# Patient Record
Sex: Female | Born: 1940 | Race: White | Hispanic: No | Smoking: Never smoker
Health system: Southern US, Community
[De-identification: ages and names within clinical notes are randomized; demographics above are authoritative.]

## PROBLEM LIST (undated history)

## (undated) DIAGNOSIS — Z950 Presence of cardiac pacemaker: Secondary | ICD-10-CM

## (undated) DIAGNOSIS — R251 Tremor, unspecified: Secondary | ICD-10-CM

## (undated) DIAGNOSIS — I2510 Atherosclerotic heart disease of native coronary artery without angina pectoris: Secondary | ICD-10-CM

## (undated) DIAGNOSIS — I509 Heart failure, unspecified: Secondary | ICD-10-CM

## (undated) DIAGNOSIS — G4730 Sleep apnea, unspecified: Secondary | ICD-10-CM

## (undated) DIAGNOSIS — F32A Depression, unspecified: Secondary | ICD-10-CM

## (undated) DIAGNOSIS — F329 Major depressive disorder, single episode, unspecified: Secondary | ICD-10-CM

## (undated) DIAGNOSIS — I10 Essential (primary) hypertension: Secondary | ICD-10-CM

## (undated) DIAGNOSIS — E119 Type 2 diabetes mellitus without complications: Secondary | ICD-10-CM

## (undated) DIAGNOSIS — I219 Acute myocardial infarction, unspecified: Secondary | ICD-10-CM

## (undated) DIAGNOSIS — I739 Peripheral vascular disease, unspecified: Secondary | ICD-10-CM

## (undated) DIAGNOSIS — I441 Atrioventricular block, second degree: Secondary | ICD-10-CM

## (undated) HISTORY — PX: ABDOMINAL HYSTERECTOMY: SHX81

## (undated) HISTORY — PX: CORONARY STENT PLACEMENT: SHX1402

## (undated) HISTORY — PX: CORONARY ARTERY BYPASS GRAFT: SHX141

## (undated) HISTORY — PX: INSERT / REPLACE / REMOVE PACEMAKER: SUR710

---

## 2000-05-03 ENCOUNTER — Other Ambulatory Visit: Admission: RE | Admit: 2000-05-03 | Discharge: 2000-05-03 | Payer: Self-pay | Admitting: Obstetrics and Gynecology

## 2014-12-22 ENCOUNTER — Observation Stay: Payer: Medicare Other

## 2014-12-22 ENCOUNTER — Emergency Department: Payer: Medicare Other

## 2014-12-22 ENCOUNTER — Observation Stay
Admit: 2014-12-22 | Discharge: 2014-12-22 | Disposition: A | Discharge status: Home / Self Care | Payer: Medicare Other | Attending: Internal Medicine | Admitting: Internal Medicine

## 2014-12-22 ENCOUNTER — Encounter: Payer: Self-pay | Admitting: Emergency Medicine

## 2014-12-22 ENCOUNTER — Inpatient Hospital Stay
Admission: EM | Admit: 2014-12-22 | Discharge: 2014-12-26 | DRG: 065 | Disposition: A | Discharge status: Skilled Nursing Facility | Payer: Medicare Other | Attending: Internal Medicine | Admitting: Internal Medicine

## 2014-12-22 DIAGNOSIS — Z7982 Long term (current) use of aspirin: Secondary | ICD-10-CM

## 2014-12-22 DIAGNOSIS — R200 Anesthesia of skin: Secondary | ICD-10-CM

## 2014-12-22 DIAGNOSIS — E860 Dehydration: Secondary | ICD-10-CM | POA: Diagnosis present

## 2014-12-22 DIAGNOSIS — Z951 Presence of aortocoronary bypass graft: Secondary | ICD-10-CM

## 2014-12-22 DIAGNOSIS — Z90710 Acquired absence of both cervix and uterus: Secondary | ICD-10-CM

## 2014-12-22 DIAGNOSIS — Z79899 Other long term (current) drug therapy: Secondary | ICD-10-CM

## 2014-12-22 DIAGNOSIS — E1151 Type 2 diabetes mellitus with diabetic peripheral angiopathy without gangrene: Secondary | ICD-10-CM | POA: Diagnosis present

## 2014-12-22 DIAGNOSIS — I6521 Occlusion and stenosis of right carotid artery: Secondary | ICD-10-CM | POA: Diagnosis present

## 2014-12-22 DIAGNOSIS — R531 Weakness: Secondary | ICD-10-CM

## 2014-12-22 DIAGNOSIS — R410 Disorientation, unspecified: Secondary | ICD-10-CM

## 2014-12-22 DIAGNOSIS — Z955 Presence of coronary angioplasty implant and graft: Secondary | ICD-10-CM

## 2014-12-22 DIAGNOSIS — I2510 Atherosclerotic heart disease of native coronary artery without angina pectoris: Secondary | ICD-10-CM | POA: Diagnosis present

## 2014-12-22 DIAGNOSIS — Z8249 Family history of ischemic heart disease and other diseases of the circulatory system: Secondary | ICD-10-CM

## 2014-12-22 DIAGNOSIS — I110 Hypertensive heart disease with heart failure: Secondary | ICD-10-CM | POA: Diagnosis present

## 2014-12-22 DIAGNOSIS — I639 Cerebral infarction, unspecified: Principal | ICD-10-CM

## 2014-12-22 DIAGNOSIS — G4733 Obstructive sleep apnea (adult) (pediatric): Secondary | ICD-10-CM | POA: Diagnosis present

## 2014-12-22 DIAGNOSIS — E785 Hyperlipidemia, unspecified: Secondary | ICD-10-CM | POA: Diagnosis present

## 2014-12-22 DIAGNOSIS — I252 Old myocardial infarction: Secondary | ICD-10-CM

## 2014-12-22 DIAGNOSIS — R040 Epistaxis: Secondary | ICD-10-CM | POA: Diagnosis present

## 2014-12-22 DIAGNOSIS — Z66 Do not resuscitate: Secondary | ICD-10-CM | POA: Diagnosis present

## 2014-12-22 DIAGNOSIS — I5032 Chronic diastolic (congestive) heart failure: Secondary | ICD-10-CM | POA: Diagnosis present

## 2014-12-22 HISTORY — DX: Depression, unspecified: F32.A

## 2014-12-22 HISTORY — DX: Type 2 diabetes mellitus without complications: E11.9

## 2014-12-22 HISTORY — DX: Acute myocardial infarction, unspecified: I21.9

## 2014-12-22 HISTORY — DX: Sleep apnea, unspecified: G47.30

## 2014-12-22 HISTORY — DX: Heart failure, unspecified: I50.9

## 2014-12-22 HISTORY — DX: Atrioventricular block, second degree: I44.1

## 2014-12-22 HISTORY — DX: Presence of cardiac pacemaker: Z95.0

## 2014-12-22 HISTORY — DX: Tremor, unspecified: R25.1

## 2014-12-22 HISTORY — DX: Essential (primary) hypertension: I10

## 2014-12-22 HISTORY — DX: Peripheral vascular disease, unspecified: I73.9

## 2014-12-22 HISTORY — DX: Atherosclerotic heart disease of native coronary artery without angina pectoris: I25.10

## 2014-12-22 HISTORY — DX: Major depressive disorder, single episode, unspecified: F32.9

## 2014-12-22 LAB — CBC WITH DIFFERENTIAL/PLATELET
BASOS ABS: 0.1 10*3/uL (ref 0–0.1)
Basophils Relative: 1 %
Eosinophils Absolute: 0.1 10*3/uL (ref 0–0.7)
Eosinophils Relative: 1 %
HEMATOCRIT: 45.9 % (ref 35.0–47.0)
Hemoglobin: 14.9 g/dL (ref 12.0–16.0)
LYMPHS PCT: 46 %
Lymphs Abs: 3.5 10*3/uL (ref 1.0–3.6)
MCH: 29.8 pg (ref 26.0–34.0)
MCHC: 32.5 g/dL (ref 32.0–36.0)
MCV: 91.8 fL (ref 80.0–100.0)
MONO ABS: 0.7 10*3/uL (ref 0.2–0.9)
Monocytes Relative: 10 %
NEUTROS ABS: 3.1 10*3/uL (ref 1.4–6.5)
Neutrophils Relative %: 42 %
Platelets: 230 10*3/uL (ref 150–440)
RBC: 5.01 MIL/uL (ref 3.80–5.20)
RDW: 15.4 % — AB (ref 11.5–14.5)
WBC: 7.4 10*3/uL (ref 3.6–11.0)

## 2014-12-22 LAB — GLUCOSE, CAPILLARY
GLUCOSE-CAPILLARY: 164 mg/dL — AB (ref 65–99)
GLUCOSE-CAPILLARY: 153 mg/dL — AB (ref 65–99)
Glucose-Capillary: 178 mg/dL — ABNORMAL HIGH (ref 65–99)
Glucose-Capillary: 184 mg/dL — ABNORMAL HIGH (ref 65–99)

## 2014-12-22 LAB — URINALYSIS COMPLETE WITH MICROSCOPIC (ARMC ONLY)
Bilirubin Urine: NEGATIVE
Glucose, UA: NEGATIVE mg/dL
Hgb urine dipstick: NEGATIVE
KETONES UR: NEGATIVE mg/dL
Leukocytes, UA: NEGATIVE
Nitrite: NEGATIVE
Protein, ur: NEGATIVE mg/dL
RBC / HPF: NONE SEEN RBC/hpf (ref 0–5)
Specific Gravity, Urine: 1.009 (ref 1.005–1.030)
pH: 5 (ref 5.0–8.0)

## 2014-12-22 LAB — URINE DRUG SCREEN, QUALITATIVE (ARMC ONLY)
AMPHETAMINES, UR SCREEN: NOT DETECTED
Barbiturates, Ur Screen: NOT DETECTED
Benzodiazepine, Ur Scrn: NOT DETECTED
Cannabinoid 50 Ng, Ur ~~LOC~~: NOT DETECTED
Cocaine Metabolite,Ur ~~LOC~~: NOT DETECTED
MDMA (ECSTASY) UR SCREEN: NOT DETECTED
METHADONE SCREEN, URINE: NOT DETECTED
OPIATE, UR SCREEN: NOT DETECTED
PHENCYCLIDINE (PCP) UR S: NOT DETECTED
Tricyclic, Ur Screen: NOT DETECTED

## 2014-12-22 LAB — COMPREHENSIVE METABOLIC PANEL
ALK PHOS: 63 U/L (ref 38–126)
ALT: 16 U/L (ref 14–54)
AST: 20 U/L (ref 15–41)
Albumin: 4.5 g/dL (ref 3.5–5.0)
Anion gap: 9 (ref 5–15)
BILIRUBIN TOTAL: 0.4 mg/dL (ref 0.3–1.2)
BUN: 27 mg/dL — AB (ref 6–20)
CALCIUM: 10 mg/dL (ref 8.9–10.3)
CO2: 26 mmol/L (ref 22–32)
CREATININE: 1.05 mg/dL — AB (ref 0.44–1.00)
Chloride: 105 mmol/L (ref 101–111)
GFR calc Af Amer: 59 mL/min — ABNORMAL LOW (ref >60)
GFR, EST NON AFRICAN AMERICAN: 51 mL/min — AB (ref >60)
Glucose, Bld: 178 mg/dL — ABNORMAL HIGH (ref 65–99)
Potassium: 4.4 mmol/L (ref 3.5–5.1)
Sodium: 140 mmol/L (ref 135–145)
TOTAL PROTEIN: 8.2 g/dL — AB (ref 6.5–8.1)

## 2014-12-22 LAB — MAGNESIUM: MAGNESIUM: 2.4 mg/dL (ref 1.7–2.4)

## 2014-12-22 LAB — TROPONIN I

## 2014-12-22 LAB — SALICYLATE LEVEL

## 2014-12-22 LAB — ACETAMINOPHEN LEVEL: Acetaminophen (Tylenol), Serum: <10 ug/mL — ABNORMAL LOW (ref 10–30)

## 2014-12-22 LAB — ETHANOL

## 2014-12-22 MED ORDER — ENOXAPARIN SODIUM 40 MG/0.4ML ~~LOC~~ SOLN
40.0000 mg | SUBCUTANEOUS | Status: DC
  Administered 2014-12-22: 20:00:00 40 mg via SUBCUTANEOUS
  Filled 2014-12-22: qty 0.4

## 2014-12-22 MED ORDER — FESOTERODINE FUMARATE ER 4 MG PO TB24
4.0000 mg | ORAL_TABLET | Freq: Every day | ORAL | Status: DC
  Administered 2014-12-22 – 2014-12-26 (×5): 4 mg via ORAL
  Filled 2014-12-22 (×5): qty 1

## 2014-12-22 MED ORDER — EZETIMIBE 10 MG PO TABS
10.0000 mg | ORAL_TABLET | Freq: Every day | ORAL | Status: DC
  Administered 2014-12-22 – 2014-12-25 (×4): 10 mg via ORAL
  Filled 2014-12-22 (×4): qty 1

## 2014-12-22 MED ORDER — FUROSEMIDE 20 MG PO TABS
20.0000 mg | ORAL_TABLET | Freq: Every day | ORAL | Status: DC
  Administered 2014-12-22: 20 mg via ORAL
  Filled 2014-12-22: qty 1

## 2014-12-22 MED ORDER — VALACYCLOVIR HCL 500 MG PO TABS
500.0000 mg | ORAL_TABLET | Freq: Two times a day (BID) | ORAL | Status: DC
  Administered 2014-12-22 – 2014-12-26 (×9): 500 mg via ORAL
  Filled 2014-12-22 (×9): qty 1

## 2014-12-22 MED ORDER — DOCUSATE SODIUM 100 MG PO CAPS
200.0000 mg | ORAL_CAPSULE | Freq: Every day | ORAL | Status: DC
  Administered 2014-12-22 – 2014-12-25 (×4): 200 mg via ORAL
  Filled 2014-12-22 (×4): qty 2

## 2014-12-22 MED ORDER — INSULIN ASPART 100 UNIT/ML ~~LOC~~ SOLN
0.0000 [IU] | Freq: Three times a day (TID) | SUBCUTANEOUS | Status: DC
  Administered 2014-12-22 (×2): 2 [IU] via SUBCUTANEOUS
  Administered 2014-12-23: 18:00:00 1 [IU] via SUBCUTANEOUS
  Administered 2014-12-23 (×2): 2 [IU] via SUBCUTANEOUS
  Administered 2014-12-24 (×2): 1 [IU] via SUBCUTANEOUS
  Administered 2014-12-24: 2 [IU] via SUBCUTANEOUS
  Administered 2014-12-25 (×2): 1 [IU] via SUBCUTANEOUS
  Administered 2014-12-25: 12:00:00 2 [IU] via SUBCUTANEOUS
  Filled 2014-12-22: qty 1
  Filled 2014-12-22: qty 2
  Filled 2014-12-22: qty 1
  Filled 2014-12-22: qty 2
  Filled 2014-12-22 (×2): qty 1
  Filled 2014-12-22: qty 2
  Filled 2014-12-22: qty 1
  Filled 2014-12-22 (×3): qty 2

## 2014-12-22 MED ORDER — STROKE: EARLY STAGES OF RECOVERY BOOK
Freq: Once | Status: AC
  Administered 2014-12-22: 13:00:00

## 2014-12-22 MED ORDER — ACETAMINOPHEN 325 MG PO TABS
ORAL_TABLET | ORAL | Status: AC
  Administered 2014-12-22: 650 mg via ORAL
  Filled 2014-12-22: qty 2

## 2014-12-22 MED ORDER — ZOLPIDEM TARTRATE 5 MG PO TABS: 5.0000 mg | ORAL_TABLET | Freq: Every evening | ORAL | Status: DC | PRN

## 2014-12-22 MED ORDER — SENNOSIDES-DOCUSATE SODIUM 8.6-50 MG PO TABS: 1.0000 | ORAL_TABLET | Freq: Every evening | ORAL | Status: DC | PRN

## 2014-12-22 MED ORDER — IOHEXOL 350 MG/ML SOLN
75.0000 mL | Freq: Once | INTRAVENOUS | Status: AC | PRN
  Administered 2014-12-22: 75 mL via INTRAVENOUS

## 2014-12-22 MED ORDER — FAMCICLOVIR 500 MG PO TABS
250.0000 mg | ORAL_TABLET | Freq: Two times a day (BID) | ORAL | Status: DC
  Filled 2014-12-22 (×2): qty 0.5

## 2014-12-22 MED ORDER — ACETAMINOPHEN 325 MG PO TABS
650.0000 mg | ORAL_TABLET | Freq: Once | ORAL | Status: AC
  Administered 2014-12-22: 650 mg via ORAL

## 2014-12-22 MED ORDER — INFLUENZA VAC SPLIT QUAD 0.5 ML IM SUSY
0.5000 mL | PREFILLED_SYRINGE | INTRAMUSCULAR | Status: AC
  Administered 2014-12-23: 09:00:00 0.5 mL via INTRAMUSCULAR
  Filled 2014-12-22: qty 0.5

## 2014-12-22 MED ORDER — CLOPIDOGREL BISULFATE 75 MG PO TABS
75.0000 mg | ORAL_TABLET | Freq: Every day | ORAL | Status: DC
  Administered 2014-12-22 – 2014-12-26 (×5): 75 mg via ORAL
  Filled 2014-12-22 (×5): qty 1

## 2014-12-22 MED ORDER — ASPIRIN 325 MG PO TABS
325.0000 mg | ORAL_TABLET | Freq: Every day | ORAL | Status: DC
  Administered 2014-12-22 – 2014-12-23 (×2): 325 mg via ORAL
  Filled 2014-12-22 (×2): qty 1

## 2014-12-22 MED ORDER — ACETAMINOPHEN 325 MG PO TABS
650.0000 mg | ORAL_TABLET | Freq: Four times a day (QID) | ORAL | Status: DC | PRN
  Administered 2014-12-22 – 2014-12-24 (×2): 650 mg via ORAL
  Filled 2014-12-22 (×3): qty 2

## 2014-12-22 MED ORDER — INSULIN ASPART 100 UNIT/ML ~~LOC~~ SOLN: 0.0000 [IU] | Freq: Every day | SUBCUTANEOUS | Status: DC

## 2014-12-22 MED ORDER — RANOLAZINE ER 500 MG PO TB12
500.0000 mg | ORAL_TABLET | Freq: Two times a day (BID) | ORAL | Status: DC
  Administered 2014-12-22 – 2014-12-26 (×9): 500 mg via ORAL
  Filled 2014-12-22 (×10): qty 1

## 2014-12-22 MED ORDER — VENLAFAXINE HCL ER 75 MG PO CP24
75.0000 mg | ORAL_CAPSULE | Freq: Every day | ORAL | Status: DC
  Administered 2014-12-22 – 2014-12-26 (×5): 75 mg via ORAL
  Filled 2014-12-22 (×6): qty 1

## 2014-12-22 MED ORDER — BUPROPION HCL ER (XL) 150 MG PO TB24
150.0000 mg | ORAL_TABLET | Freq: Every day | ORAL | Status: DC
  Administered 2014-12-22 – 2014-12-26 (×5): 150 mg via ORAL
  Filled 2014-12-22 (×5): qty 1

## 2014-12-22 MED ORDER — NITROGLYCERIN 0.4 MG SL SUBL: 0.4000 mg | SUBLINGUAL_TABLET | SUBLINGUAL | Status: DC | PRN

## 2014-12-22 MED ORDER — SIMVASTATIN 40 MG PO TABS
40.0000 mg | ORAL_TABLET | Freq: Every day | ORAL | Status: DC
  Administered 2014-12-22: 40 mg via ORAL
  Filled 2014-12-22: qty 1

## 2014-12-22 MED ORDER — FUROSEMIDE 10 MG/ML IJ SOLN
20.0000 mg | Freq: Once | INTRAMUSCULAR | Status: AC
  Administered 2014-12-22: 20 mg via INTRAVENOUS
  Filled 2014-12-22: qty 4

## 2014-12-22 MED ORDER — TRAMADOL HCL 50 MG PO TABS
50.0000 mg | ORAL_TABLET | Freq: Four times a day (QID) | ORAL | Status: DC | PRN
  Administered 2014-12-22 – 2014-12-25 (×3): 50 mg via ORAL
  Filled 2014-12-22 (×4): qty 1

## 2014-12-22 MED ORDER — CLONAZEPAM 1 MG PO TABS
1.0000 mg | ORAL_TABLET | Freq: Three times a day (TID) | ORAL | Status: DC
  Administered 2014-12-22 – 2014-12-26 (×13): 1 mg via ORAL
  Filled 2014-12-22 (×13): qty 1

## 2014-12-22 MED ORDER — ATENOLOL 50 MG PO TABS: 25.0000 mg | ORAL_TABLET | Freq: Every day | ORAL | Status: DC

## 2014-12-22 MED ORDER — LISINOPRIL 10 MG PO TABS: 10.0000 mg | ORAL_TABLET | Freq: Every day | ORAL | Status: DC

## 2014-12-22 MED ORDER — BUPROPION HCL ER (SR) 150 MG PO TB12: 150.0000 mg | ORAL_TABLET | Freq: Every day | ORAL | Status: DC

## 2014-12-22 NOTE — ED Notes (Signed)
Attempted to call report

## 2014-12-22 NOTE — H&P (Signed)
[facility] - [place] at [facility]   PATIENT NAME: Carla Dunn    MR#:  [id number]  DATE OF BIRTH:  03-Jan-1941  DATE OF ADMISSION:  12/22/2014  PRIMARY CARE PHYSICIAN: Michail Sermon, MD   REQUESTING/REFERRING PHYSICIAN: Governor Rooks, MD  CHIEF COMPLAINT:   Chief Complaint  Patient presents with  . Altered Mental Status  left side numbness and tingling today.  HISTORY OF PRESENT ILLNESS:  Carla Dunn  is a 74 y.o. female with a known history of hypertension, diabetes, CAD, diastolic CHF, PVD and sleep apnea. The patient is a poor historian. According to her daughter, the patient started to have a left facial numbness and tingling in the left arm and leg numbness and tingling at about 3 am today. The patient was found unresponsive for a few minutes at about 5 AM. So her daughter called EMS. The patient has generalized weakness but denies any slurred speech or dysphagia. She denies any headache, dizziness or incontinence. CAT scan of head is negative for stroke.  PAST MEDICAL HISTORY:   Past Medical History  Diagnosis Date  . Diabetes mellitus without complication (HCC)   . Tremors of nervous system   . Pacemaker   . Peripheral vascular disease (HCC)   . CHF (congestive heart failure) (HCC)   . Sleep apnea   . Myocardial infarction (HCC)   . Coronary artery disease   . Depression   . Second degree heart block     PAST SURGICAL HISTORY:   Past Surgical History  Procedure Laterality Date  . Coronary artery bypass graft    . Coronary stent placement    . Abdominal hysterectomy    . Insert / replace / remove pacemaker      SOCIAL HISTORY:   Social History  Substance Use Topics  . Smoking status: Unknown If Ever Smoked  . Smokeless tobacco: Not on file  . Alcohol Use: Not on file    FAMILY HISTORY:   Family History  Problem Relation Age of Onset  . Heart attack Mother   . Heart attack Father   . Heart attack Brother     DRUG  ALLERGIES:  No Known Allergies  REVIEW OF SYSTEMS:  CONSTITUTIONAL: No fever, has weakness.  EYES: Has blurred vision, no double vision.  EARS, NOSE, AND THROAT: No tinnitus or ear pain.  RESPIRATORY: No cough, shortness of breath, wheezing or hemoptysis.  CARDIOVASCULAR: No chest pain, orthopnea, edema.  GASTROINTESTINAL: No nausea, vomiting, diarrhea or abdominal pain.  GENITOURINARY: No dysuria, hematuria.  ENDOCRINE: No polyuria, nocturia,  HEMATOLOGY: No anemia, easy bruising or bleeding SKIN: No rash or lesion. MUSCULOSKELETAL: No joint pain or arthritis.   NEUROLOGIC: facial tingling, numbness on left side, left arm and leg numbness, tingling and weakness.  PSYCHIATRY: No anxiety or depression.   MEDICATIONS AT HOME:   Prior to Admission medications   Medication Sig Start Date End Date Taking? Authorizing Provider  Ascorbic Acid (VITAMIN C) 1000 MG tablet Take 1,000 mg by mouth daily.   Yes Historical Provider, MD  aspirin 81 MG tablet Take 81 mg by mouth daily.   Yes Historical Provider, MD  atenolol (TENORMIN) 25 MG tablet Take 25 mg by mouth daily.   Yes Historical Provider, MD  buPROPion (WELLBUTRIN SR) 150 MG 12 hr tablet Take 150 mg by mouth daily.   Yes Historical Provider, MD  calcium-vitamin D (OSCAL WITH D) 500-200 MG-UNIT tablet Take 1 tablet by mouth.   Yes Historical Provider,  MD  clonazePAM (KLONOPIN) 1 MG tablet Take 1 mg by mouth 3 (three) times daily.   Yes Historical Provider, MD  clopidogrel (PLAVIX) 75 MG tablet Take 75 mg by mouth daily.   Yes Historical Provider, MD  co-enzyme Q-10 30 MG capsule Take 100 mg by mouth 3 (three) times daily.   Yes Historical Provider, MD  desvenlafaxine (PRISTIQ) 50 MG 24 hr tablet Take 50 mg by mouth daily.   Yes Historical Provider, MD  docusate sodium (COLACE) 100 MG capsule Take 200 mg by mouth at bedtime.   Yes Historical Provider, MD  ezetimibe (ZETIA) 10 MG tablet Take 10 mg by mouth daily.   Yes Historical Provider,  MD  famciclovir (FAMVIR) 250 MG tablet Take 250 mg by mouth 2 (two) times daily.   Yes Historical Provider, MD  furosemide (LASIX) 20 MG tablet Take 20 mg by mouth.   Yes Historical Provider, MD  lisinopril (PRINIVIL,ZESTRIL) 10 MG tablet Take 10 mg by mouth at bedtime.   Yes Historical Provider, MD  magnesium oxide (MAG-OX) 400 MG tablet Take 500 mg by mouth daily.   Yes Historical Provider, MD  ranolazine (RANEXA) 500 MG 12 hr tablet Take 500 mg by mouth 2 (two) times daily.   Yes Historical Provider, MD  simvastatin (ZOCOR) 10 MG tablet Take 10 mg by mouth daily.   Yes Historical Provider, MD  sitaGLIPtin (JANUVIA) 100 MG tablet Take 100 mg by mouth daily.   Yes Historical Provider, MD  tolterodine (DETROL LA) 2 MG 24 hr capsule Take 2 mg by mouth daily.   Yes Historical Provider, MD  zolpidem (AMBIEN) 5 MG tablet Take 5 mg by mouth at bedtime as needed for sleep.   Yes Historical Provider, MD  nitroGLYCERIN (NITROSTAT) 0.4 MG SL tablet Place 0.4 mg under the tongue every 5 (five) minutes as needed for chest pain.    Historical Provider, MD  traMADol (ULTRAM) 50 MG tablet Take by mouth every 8 (eight) hours as needed for moderate pain.    Historical Provider, MD      VITAL SIGNS:  Blood pressure 122/58, pulse 71, temperature 97.8 F (36.6 C), temperature source Oral, resp. rate 22, weight 83.643 kg (184 lb 6.4 oz), SpO2 96 %.  PHYSICAL EXAMINATION:  GENERAL:  74 y.o.-year-old patient lying in the bed with no acute distress.  EYES: Pupils equal, round, reactive to light and accommodation. No scleral icterus. Extraocular muscles intact.  HEENT: Head atraumatic, normocephalic. Oropharynx and nasopharynx clear.  NECK:  Supple, no jugular venous distention. No thyroid enlargement, no tenderness.  LUNGS: Normal breath sounds bilaterally, no wheezing, rales,rhonchi or crepitation. No use of accessory muscles of respiration.  CARDIOVASCULAR: S1, S2 normal. No murmurs, rubs, or gallops.  ABDOMEN:  Soft, nontender, nondistended. Bowel sounds present. No organomegaly or mass.  EXTREMITIES: No pedal edema, cyanosis, or clubbing.  NEUROLOGIC: Cranial nerves II through XII are intact. Muscle strength 2/5 in lower extremities, 4/5 in upper extremities. Sensation intact. Gait not checked.  PSYCHIATRIC: The patient is alert and oriented x 3.  SKIN: No obvious rash, lesion, or ulcer.   LABORATORY PANEL:   CBC  Recent Labs Lab 12/22/14 0613  WBC 7.4  HGB 14.9  HCT 45.9  PLT 230   ------------------------------------------------------------------------------------------------------------------  Chemistries   Recent Labs Lab 12/22/14 0613  NA 140  K 4.4  CL 105  CO2 26  GLUCOSE 178*  BUN 27*  CREATININE 1.05*  CALCIUM 10.0  AST 20  ALT 16  ALKPHOS  63  BILITOT 0.4   ------------------------------------------------------------------------------------------------------------------  Cardiac Enzymes  Recent Labs Lab 12/22/14 0613  TROPONINI <0.03   ------------------------------------------------------------------------------------------------------------------  RADIOLOGY:  Ct Head Wo Contrast  12/22/2014   CLINICAL DATA:  Awoke at 3:30 a.m., tachycardia, arrived from hotel. 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  EXAM: CT HEAD WITHOUT CONTRAST  TECHNIQUE: Contiguous axial images were obtained from the base of the skull through the vertex without intravenous contrast.  COMPARISON:  None.  FINDINGS: Mild noisy image quality.  The ventricles and sulci are normal for age. No intraparenchymal hemorrhage, mass effect nor midline shift. Patchy supratentorial Glance matter hypodensities are less than expected for patient's age and though non-specific suggest sequelae of chronic small vessel ischemic disease. No acute large vascular territory infarcts.  No abnormal extra-axial fluid collections. Basal cisterns are patent. Severe calcific atherosclerosis of the carotid siphons and  included vertebral arteries.  No skull fracture. The included ocular globes and orbital contents are non-suspicious. Status post bilateral ocular lens implant. The mastoid aircells and included paranasal sinuses are well-aerated.  IMPRESSION: No acute intracranial process.  Severe calcific atherosclerosis. Involutional changes and mild chronic small vessel ischemic disease.   Electronically Signed   By: Awilda Metro M.D.   On: 12/22/2014 06:50   Dg Chest Portable 1 View  12/22/2014   CLINICAL DATA:  Acute onset of tachycardia. Patient not feeling right. Initial encounter.  EXAM: PORTABLE CHEST 1 VIEW  COMPARISON:  None.  FINDINGS: The lungs are mildly hypoexpanded. Vascular crowding and vascular congestion are seen, with mildly increased interstitial markings, raising question for mild interstitial edema. There is no evidence of pleural effusion or pneumothorax.  The cardiomediastinal silhouette is borderline normal in size. The patient is status post median sternotomy. A pacemaker is noted overlying the left chest wall, with leads ending overlying the right atrium and right ventricle. No acute osseous abnormalities are seen. Scattered clips are seen overlying the left hemithorax.  IMPRESSION: Lungs mildly hypoexpanded. Vascular congestion noted, with mildly increased interstitial markings, raising question for mild interstitial edema.   Electronically Signed   By: Roanna Raider M.D.   On: 12/22/2014 07:10    EKG:   Orders placed or performed during the hospital encounter of 12/22/14  . EKG 12-Lead  . EKG 12-Lead  . ED EKG  . ED EKG    IMPRESSION AND PLAN:   Left side numbness and tingling, R/O acute CVA Dehydration CAD HTN DM2 PVD Chronic diastolic CHF OSA   Plan:  Tele monitor, start ASA, plavix, zocor. Follow up echo, carotid Dupplex, lipid panel. Neurology consult. 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 PT, OT. Continue HTN meds. Sliding scale.     All the records are reviewed  and case discussed with ED provider. Management plans discussed with the patient, her daughter and they are in agreement.  CODE STATUS: DNR  TOTAL TIME TAKING CARE OF THIS PATIENT: 55 minutes.    Shaune Pollack M.D on 12/22/2014 at 8:33 AM  Between 7am to 6pm - Pager - [phone]  After 6pm go to [email or web address] - password [email or web address] [facility]  [place] [facility]  Office  [phone]  CC: Primary care physician; Michail Sermon, MD

## 2014-12-22 NOTE — Progress Notes (Cosign Needed)
This patient has received 15 ml's of IV omni 350 (type of contrast) contrast extravasation into left forarm (part of body) during a CT Angio Neck exam.  The exam was performed on (date) 12/22/14  Site / affected area assessed by Alan Ripper to Palma Sola, RN

## 2014-12-22 NOTE — Plan of Care (Signed)
Problem: Discharge/Transitional Outcomes Goal: Other Discharge Outcomes/Goals Outcome: Progressing Patient admitted today with symptoms of cva with left sided weakness and numbness, vss, daughter at bedside and helps patient at home, patient continues to have neuro checks every 2 hours which have been the same as on admission, patient had ct, u/s, and echo today.

## 2014-12-22 NOTE — ED Notes (Signed)
Pt ambulated to restroom with 2 person assist 

## 2014-12-22 NOTE — ED Notes (Signed)
Pt reports tingling to left side, MD informed. No new orders at this time.

## 2014-12-22 NOTE — Progress Notes (Signed)
*  PRELIMINARY RESULTS* Echocardiogram 2D Echocardiogram has been performed.  Carla Dunn 12/22/2014, 11:03 AM

## 2014-12-22 NOTE — ED Notes (Signed)
Patient returns from CT

## 2014-12-22 NOTE — ED Provider Notes (Signed)
[facility] Emergency Department Provider Note   ____________________________________________  Time seen: 7:15 AM I have reviewed the triage vital signs and the triage nursing note.  HISTORY  Chief Complaint Altered Mental Status   Historian Limited historian, patient is a poor historian Daughter provides some history.  HPI Carla Dunn is a 74 y.o. female who is here for evaluation of confusion/altered mental status as well as left facial arm and leg numbness. Patient daughter are currently living in a hotel out of concern about mold in their home. Patient states she hasn't felt well for a couple of days, however this morning she woke up around 3:30 and told her daughter that her heart was racing. This has happen time to time and she is to take an extra dose of atenolol when this happens. She did take an extra dose of atenolol and then went back to sleep. She woke up around 5:30 and was extremely weak all over and told her daughter that she needed to go to the hospital.  She does have a history of sleep apnea, however has not been using her CPAP machine recently. She has history of recent nosebleeds.  It is a little unclear exactly when the left facial arm and leg numbness started, however patient states that she thinks it was there yesterday.  No abdominal pain, nausea, vomiting or diarrhea. No fever, cough, congestion. No skin rashes. No tick bites.    Past Medical History  Diagnosis Date  . Diabetes mellitus without complication (HCC)   . Tremors of nervous system   . Pacemaker     There are no active problems to display for this patient.   Past Surgical History  Procedure Laterality Date  . Coronary artery bypass graft    . Coronary stent placement      Current Outpatient Rx  Name  Route  Sig  Dispense  Refill  . Ascorbic Acid (VITAMIN C) 1000 MG tablet   Oral   Take 1,000 mg by mouth daily.         Marland Kitchen aspirin 81 MG tablet   Oral   Take  81 mg by mouth daily.         Marland Kitchen atenolol (TENORMIN) 25 MG tablet   Oral   Take 25 mg by mouth daily.         Marland Kitchen buPROPion (WELLBUTRIN SR) 150 MG 12 hr tablet   Oral   Take 150 mg by mouth daily.         . calcium-vitamin D (OSCAL WITH D) 500-200 MG-UNIT tablet   Oral   Take 1 tablet by mouth.         . clonazePAM (KLONOPIN) 1 MG tablet   Oral   Take 1 mg by mouth 3 (three) times daily.         . clopidogrel (PLAVIX) 75 MG tablet   Oral   Take 75 mg by mouth daily.         Marland Kitchen co-enzyme Q-10 30 MG capsule   Oral   Take 100 mg by mouth 3 (three) times daily.         Marland Kitchen desvenlafaxine (PRISTIQ) 50 MG 24 hr tablet   Oral   Take 50 mg by mouth daily.         Marland Kitchen docusate sodium (COLACE) 100 MG capsule   Oral   Take 200 mg by mouth at bedtime.         Marland Kitchen ezetimibe (ZETIA) 10 MG tablet  Oral   Take 10 mg by mouth daily.         . famciclovir (FAMVIR) 250 MG tablet   Oral   Take 250 mg by mouth 2 (two) times daily.         . furosemide (LASIX) 20 MG tablet   Oral   Take 20 mg by mouth.         Marland Kitchen lisinopril (PRINIVIL,ZESTRIL) 10 MG tablet   Oral   Take 10 mg by mouth at bedtime.         . magnesium oxide (MAG-OX) 400 MG tablet   Oral   Take 500 mg by mouth daily.         . ranolazine (RANEXA) 500 MG 12 hr tablet   Oral   Take 500 mg by mouth 2 (two) times daily.         . simvastatin (ZOCOR) 10 MG tablet   Oral   Take 10 mg by mouth daily.         . sitaGLIPtin (JANUVIA) 100 MG tablet   Oral   Take 100 mg by mouth daily.         Marland Kitchen tolterodine (DETROL LA) 2 MG 24 hr capsule   Oral   Take 2 mg by mouth daily.         Marland Kitchen zolpidem (AMBIEN) 5 MG tablet   Oral   Take 5 mg by mouth at bedtime as needed for sleep.         . nitroGLYCERIN (NITROSTAT) 0.4 MG SL tablet   Sublingual   Place 0.4 mg under the tongue every 5 (five) minutes as needed for chest pain.         . traMADol (ULTRAM) 50 MG tablet   Oral   Take by mouth  every 8 (eight) hours as needed for moderate pain.           Allergies Review of patient's allergies indicates no known allergies.  History reviewed. No pertinent family history.  Social History Social History  Substance Use Topics  . Smoking status: Unknown If Ever Smoked  . Smokeless tobacco: None  . Alcohol Use: None    Review of Systems  Constitutional: Negative for fever. Eyes: Negative for visual changes. ENT: Negative for sore throat. Cardiovascular: Negative for chest pain. Respiratory: Negative for shortness of breath. Gastrointestinal: Negative for abdominal pain, vomiting and diarrhea. Genitourinary: Negative for dysuria. Musculoskeletal: Negative for back pain. Skin: Negative for rash. Neurological: Positive for occasional headaches. None now. 10 point Review of Systems otherwise negative ____________________________________________   PHYSICAL EXAM:  VITAL SIGNS: ED Triage Vitals  Enc Vitals Group     BP 12/22/14 0615 138/76 mmHg     Pulse Rate 12/22/14 0615 82     Resp 12/22/14 0615 20     Temp 12/22/14 0621 97.8 F (36.6 C)     Temp Source 12/22/14 0621 Oral     SpO2 12/22/14 0615 84 %     Weight 12/22/14 0615 184 lb 6.4 oz (83.643 kg)     Height --      Head Cir --      Peak Flow --      Pain Score --      Pain Loc --      Pain Edu? --      Excl. in GC? --      Constitutional: Alert and slightly confused to place. Cooperative, but somewhat slow to answer. In no distress. Eyes: Conjunctivae are  normal. Right eyelid slightly droopy compared to the left.  PERRL. Normal extraocular movements. ENT   Head: Normocephalic and atraumatic.   Nose: No congestion/rhinnorhea.   Mouth/Throat: Mucous membranes are moist.   Neck: No stridor. Cardiovascular/Chest: Normal rate, regular rhythm.  No murmurs, rubs, or gallops. Respiratory: Normal respiratory effort without tachypnea nor retractions. Breath sounds are clear and equal bilaterally.  No wheezes/rales/rhonchi. Gastrointestinal: Soft. No distention, no guarding, no rebound. Nontender. Obese Genitourinary/rectal:Deferred Musculoskeletal:No joint effusions.  No lower extremity tenderness.  No edema. Neurologic: Alert and cooperative, but slow to speak.  No dysarthria appreciated.  Disoriented to place - thought it was [place] where she would typically go for medical care.  4/5 strength in bilateral upper extremities and lower extremities all muscle groups. Paresthesia left face including the forehead, left arm and left leg. Unable to assess coordination as patient is too weak. No facial droop. No abnormality and tongue protrusion. Skin:  Skin is warm, dry and intact. No rash noted. Psychiatric: Affect is flat. No apparent hallucinations..  ____________________________________________   EKG I, Governor Rooks, MD, the attending physician have personally viewed and interpreted all ECGs.  84 bpm. Normal sinus rhythm. Left bundle branch block. No prior EKG for comparison ____________________________________________  LABS (pertinent positives/negatives)  Urinalysis negative other than rare bacteria Urine drug screen negative [place] blood cell count 7.4 with no left shift. Hemoglobin 14.9, platelet count 239, bands metabolic panel significant for BUN 27 and creatinine 1.05 and otherwise without significant abnormality Troponin less than 0.03 Ethanol less than 5 Acetaminophen less than 10 Salicylate less than 4 ____________________________________________  RADIOLOGY All Xrays were viewed by me. Imaging interpreted by Radiologist.  Chest x-ray one view portable:  IMPRESSION: Lungs mildly hypoexpanded. Vascular congestion noted, with mildly increased interstitial markings, raising question for mild interstitial edema.  CT head noncontrast:  IMPRESSION: No acute intracranial process.  Severe calcific atherosclerosis. Involutional changes and mild chronic small vessel  ischemic disease. __________________________________________  PROCEDURES  Procedure(s) performed: None  Critical Care performed: None  ____________________________________________   ED COURSE / ASSESSMENT AND PLAN  CONSULTATIONS: Phone consultation with hospitalist for admission  Pertinent labs & imaging results that were available during my care of the patient were reviewed by me and considered in my medical decision making (see chart for details).   I'm unclear overall what's the source of this patient's condition, however I am concerned about the focal neurologic complaint of paresthesia across the left face, arm, and leg. Others unable to determine an exact time of onset, however it sounds like she actually had the symptoms yesterday. She would be outside of any TPA window based on time of onset of symptoms. She already takes baby aspirin and Plavix, this is for history of bypass/coronary artery disease/congestive heart failure.  Patient has some generalized weakness and tremor in all 4 extremities, which is nonfocal. Unclear what the etiology of this is.  Patient was noted to have hypoxia initially, and on room air at rest patient's O2 sat is around 90-91%. She may have an element of congestive heart failure based on edema on the chest x-ray. I will give her dose of Lasix 20 mg IV.  Patient / Family / Caregiver informed of clinical course, medical decision-making process, and agree with plan.   ___________________________________________   FINAL CLINICAL IMPRESSION(S) / ED DIAGNOSES   Final diagnoses:  Left sided numbness  Generalized weakness  Confusion       Governor Rooks, MD 12/22/14 [id number]

## 2014-12-22 NOTE — ED Notes (Signed)
Patient transported to CT 

## 2014-12-22 NOTE — ED Notes (Addendum)
Pt arrives via EMS from hotel.  Pt awoke at 0330 stating that she wasn't "feeling right" and that her heart was racing.  Pts daughter gave atenolol.  Pt went to sleep and awoke again at 0530 stating that she doesn't feel right.  Pt arrives responsive to pain.  Pt tearful for assessment.  Pt not answering questions appropriately.  Daughter states they are in hotel d/t mold in home and that Mom is also out of Klonopin.

## 2014-12-22 NOTE — ED Notes (Signed)
MD at bedside. 

## 2014-12-22 NOTE — Consult Note (Signed)
Reason for Consult:  stroke Referring Physician:  Dr. Mariana Single Carla Dunn is an 74 y.o. female.  HPI:  Seen at request of Dr. Bridgett Larsson for stroke;  74 yo RHD F presents to [facility] after waking up with some L sided numbness that occurred when she awakened this morning.  She reports weakness as well.  She denies headache.  She has not had this before.  No vision complaints.  Her numbness and weakness persists now and has not improved since being here.  Past Medical History  Diagnosis Date  . Diabetes mellitus without complication ([place])   . Tremors of nervous system   . Pacemaker   . Peripheral vascular disease ([place])   . CHF (congestive heart failure) ([place])   . Sleep apnea   . Myocardial infarction ([place])   . Coronary artery disease   . Depression   . Second degree heart block     Past Surgical History  Procedure Laterality Date  . Coronary artery bypass graft    . Coronary stent placement    . Abdominal hysterectomy    . Insert / replace / remove pacemaker      Family History  Problem Relation Age of Onset  . Heart attack Mother   . Heart attack Father   . Heart attack Brother     Social History:  reports that she has never smoked. She has never used smokeless tobacco. Her alcohol and drug histories are not on file.  Allergies: No Known Allergies  Medications: personally reviewed by me as per chart  Results for orders placed or performed during the hospital encounter of 12/22/14 (from the past 48 hour(s))  CBC with Differential     Status: Abnormal   Collection Time: 12/22/14  6:13 AM  Result Value Ref Range   WBC 7.4 3.6 - 11.0 K/uL   RBC 5.01 3.80 - 5.20 MIL/uL   Hemoglobin 14.9 12.0 - 16.0 g/dL   HCT 45.9 35.0 - 47.0 %   MCV 91.8 80.0 - 100.0 fL   MCH 29.8 26.0 - 34.0 pg   MCHC 32.5 32.0 - 36.0 g/dL   RDW 15.4 (H) 11.5 - 14.5 %   Platelets 230 150 - 440 K/uL   Neutrophils Relative % 42 %   Neutro Abs 3.1 1.4 - 6.5 K/uL   Lymphocytes Relative 46 %   Lymphs Abs 3.5 1.0 -  3.6 K/uL   Monocytes Relative 10 %   Monocytes Absolute 0.7 0.2 - 0.9 K/uL   Eosinophils Relative 1 %   Eosinophils Absolute 0.1 0 - 0.7 K/uL   Basophils Relative 1 %   Basophils Absolute 0.1 0 - 0.1 K/uL  Comprehensive metabolic panel     Status: Abnormal   Collection Time: 12/22/14  6:13 AM  Result Value Ref Range   Sodium 140 135 - 145 mmol/L   Potassium 4.4 3.5 - 5.1 mmol/L   Chloride 105 101 - 111 mmol/L   CO2 26 22 - 32 mmol/L   Glucose, Bld 178 (H) 65 - 99 mg/dL   BUN 27 (H) 6 - 20 mg/dL   Creatinine, Ser 1.05 (H) 0.44 - 1.00 mg/dL   Calcium 10.0 8.9 - 10.3 mg/dL   Total Protein 8.2 (H) 6.5 - 8.1 g/dL   Albumin 4.5 3.5 - 5.0 g/dL   AST 20 15 - 41 U/L   ALT 16 14 - 54 U/L   Alkaline Phosphatase 63 38 - 126 U/L   Total Bilirubin 0.4 0.3 -  1.2 mg/dL   GFR calc non Af Amer 51 (L) >60 mL/min   GFR calc Af Amer 59 (L) >60 mL/min    Comment: (NOTE) The eGFR has been calculated using the CKD EPI equation. This calculation has not been validated in all clinical situations. eGFR's persistently <60 mL/min signify possible Chronic Kidney Disease.    Anion gap 9 5 - 15  Troponin I     Status: None   Collection Time: 12/22/14  6:13 AM  Result Value Ref Range   Troponin I <0.03 <0.031 ng/mL    Comment:        NO INDICATION OF MYOCARDIAL INJURY.   Ethanol     Status: None   Collection Time: 12/22/14  6:13 AM  Result Value Ref Range   Alcohol, Ethyl (B) <5 <5 mg/dL    Comment:        LOWEST DETECTABLE LIMIT FOR SERUM ALCOHOL IS 5 mg/dL FOR MEDICAL PURPOSES ONLY   Acetaminophen level     Status: Abnormal   Collection Time: 12/22/14  6:13 AM  Result Value Ref Range   Acetaminophen (Tylenol), Serum <10 (L) 10 - 30 ug/mL    Comment:        THERAPEUTIC CONCENTRATIONS VARY SIGNIFICANTLY. A RANGE OF 10-30 ug/mL MAY BE AN EFFECTIVE CONCENTRATION FOR MANY PATIENTS. HOWEVER, SOME ARE BEST TREATED AT CONCENTRATIONS OUTSIDE THIS RANGE. ACETAMINOPHEN CONCENTRATIONS >150 ug/mL  AT 4 HOURS AFTER INGESTION AND >50 ug/mL AT 12 HOURS AFTER INGESTION ARE OFTEN ASSOCIATED WITH TOXIC REACTIONS.   Salicylate level     Status: None   Collection Time: 12/22/14  6:13 AM  Result Value Ref Range   Salicylate Lvl <0.9 2.8 - 30.0 mg/dL  Magnesium     Status: None   Collection Time: 12/22/14  6:13 AM  Result Value Ref Range   Magnesium 2.4 1.7 - 2.4 mg/dL  Urinalysis complete, with microscopic ([facility] only)     Status: Abnormal   Collection Time: 12/22/14  6:25 AM  Result Value Ref Range   Color, Urine STRAW (A) YELLOW   APPearance CLEAR (A) CLEAR   Glucose, UA NEGATIVE NEGATIVE mg/dL   Bilirubin Urine NEGATIVE NEGATIVE   Ketones, ur NEGATIVE NEGATIVE mg/dL   Specific Gravity, Urine 1.009 1.005 - 1.030   Hgb urine dipstick NEGATIVE NEGATIVE   pH 5.0 5.0 - 8.0   Protein, ur NEGATIVE NEGATIVE mg/dL   Nitrite NEGATIVE NEGATIVE   Leukocytes, UA NEGATIVE NEGATIVE   RBC / HPF NONE SEEN 0 - 5 RBC/hpf   WBC, UA 0-5 0 - 5 WBC/hpf   Bacteria, UA RARE (A) NONE SEEN   Squamous Epithelial / LPF 0-5 (A) NONE SEEN  Urine Drug Screen, Qualitative ([facility] only)     Status: None   Collection Time: 12/22/14  6:25 AM  Result Value Ref Range   Tricyclic, Ur Screen NONE DETECTED NONE DETECTED   Amphetamines, Ur Screen NONE DETECTED NONE DETECTED   MDMA (Ecstasy)Ur Screen NONE DETECTED NONE DETECTED   Cocaine Metabolite,Ur [place] NONE DETECTED NONE DETECTED   Opiate, Ur Screen NONE DETECTED NONE DETECTED   Phencyclidine (PCP) Ur S NONE DETECTED NONE DETECTED   Cannabinoid 50 Ng, Ur [place] NONE DETECTED NONE DETECTED   Barbiturates, Ur Screen NONE DETECTED NONE DETECTED   Benzodiazepine, Ur Scrn NONE DETECTED NONE DETECTED   Methadone Scn, Ur NONE DETECTED NONE DETECTED    Comment: (NOTE) 381  Tricyclics, urine  Cutoff 1000 ng/mL 200  Amphetamines, urine             Cutoff 1000 ng/mL 300  MDMA (Ecstasy), urine           Cutoff 500 ng/mL 400  Cocaine Metabolite, urine       Cutoff  300 ng/mL 500  Opiate, urine                   Cutoff 300 ng/mL 600  Phencyclidine (PCP), urine      Cutoff 25 ng/mL 700  Cannabinoid, urine              Cutoff 50 ng/mL 800  Barbiturates, urine             Cutoff 200 ng/mL 900  Benzodiazepine, urine           Cutoff 200 ng/mL 1000 Methadone, urine                Cutoff 300 ng/mL 1100 1200 The urine drug screen provides only a preliminary, unconfirmed 1300 analytical test result and should not be used for non-medical 1400 purposes. Clinical consideration and professional judgment should 1500 be applied to any positive drug screen result due to possible 1600 interfering substances. A more specific alternate chemical method 1700 must be used in order to obtain a confirmed analytical result.  1800 Gas chromato graphy / mass spectrometry (GC/MS) is the preferred 1900 confirmatory method.   Glucose, capillary     Status: Abnormal   Collection Time: 12/22/14 10:27 AM  Result Value Ref Range   Glucose-Capillary 178 (H) 65 - 99 mg/dL   Comment 1 Notify RN   Glucose, capillary     Status: Abnormal   Collection Time: 12/22/14 11:12 AM  Result Value Ref Range   Glucose-Capillary 164 (H) 65 - 99 mg/dL  Glucose, capillary     Status: Abnormal   Collection Time: 12/22/14  4:24 PM  Result Value Ref Range   Glucose-Capillary 184 (H) 65 - 99 mg/dL  Glucose, capillary     Status: Abnormal   Collection Time: 12/22/14  9:11 PM  Result Value Ref Range   Glucose-Capillary 153 (H) 65 - 99 mg/dL   Comment 1 Notify RN     Ct Head Wo Contrast  12/22/2014   CLINICAL DATA:  Awoke at 3:30 a.m., tachycardia, arrived from hotel. 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  EXAM: CT HEAD WITHOUT CONTRAST  TECHNIQUE: Contiguous axial images were obtained from the base of the skull through the vertex without intravenous contrast.  COMPARISON:  None.  FINDINGS: Mild noisy image quality.  The ventricles and sulci are normal for age. No intraparenchymal hemorrhage,  mass effect nor midline shift. Patchy supratentorial Whitt matter hypodensities are less than expected for patient's age and though non-specific suggest sequelae of chronic small vessel ischemic disease. No acute large vascular territory infarcts.  No abnormal extra-axial fluid collections. Basal cisterns are patent. Severe calcific atherosclerosis of the carotid siphons and included vertebral arteries.  No skull fracture. The included ocular globes and orbital contents are non-suspicious. Status post bilateral ocular lens implant. The mastoid aircells and included paranasal sinuses are well-aerated.  IMPRESSION: No acute intracranial process.  Severe calcific atherosclerosis. Involutional changes and mild chronic small vessel ischemic disease.   Electronically Signed   By: Elon Alas M.D.   On: 12/22/2014 06:50   US Carotid Bilateral  12/22/2014   CLINICAL DATA:  Stroke.  EXAM: BILATERAL CAROTID DUPLEX ULTRASOUND  TECHNIQUE:  Gray scale imaging, color Doppler and duplex ultrasound were performed of bilateral carotid and vertebral arteries in the neck.  COMPARISON:  None.  FINDINGS: Criteria: Quantification of carotid stenosis is based on velocity parameters that correlate the residual internal carotid diameter with NASCET-based stenosis levels, using the diameter of the distal internal carotid lumen as the denominator for stenosis measurement.  The following velocity measurements were obtained:  RIGHT  ICA:  132/31 cm/sec  CCA:  54/0 cm/sec  SYSTOLIC ICA/CCA RATIO:  1.7  DIASTOLIC ICA/CCA RATIO:  4.0  ECA:  195 cm/sec  LEFT  ICA:  116/37 cm/sec  CCA:  086/76 cm/sec  SYSTOLIC ICA/CCA RATIO:  1.0  DIASTOLIC ICA/CCA RATIO:  1.8  ECA:  148 cm/sec  RIGHT CAROTID ARTERY: Moderate eccentric calcified plaque formation is noted throughout the right common carotid artery. Eccentric calcified plaque is also noted in the right carotid bulb and proximal right internal carotid artery consistent with 50-69% stenosis  based on ultrasound and Doppler criteria.  RIGHT VERTEBRAL ARTERY:  Antegrade flow is noted.  LEFT CAROTID ARTERY: Moderate eccentric calcified plaque is noted throughout the left common carotid artery. Moderate calcified plaque is noted in the left carotid bulb, with mild calcified plaque noted in the proximal left internal carotid artery. These findings are consistent less than 50% diameter stenosis based on ultrasound and Doppler criteria.  LEFT VERTEBRAL ARTERY:  Antegrade flow is noted.  IMPRESSION: Moderate eccentric calcified plaque is noted in the right carotid bulb and proximal right internal carotid artery consistent with 50-69% stenosis based on ultrasound and Doppler criteria.  Mild calcified eccentric plaque formation is noted in the proximal left internal carotid artery consistent with less than 50% diameter stenosis based on ultrasound and Doppler criteria.   Electronically Signed   By: Marijo Conception, M.D.   On: 12/22/2014 10:43   Dg Chest Portable 1 View  12/22/2014   CLINICAL DATA:  Acute onset of tachycardia. Patient not feeling right. Initial encounter.  EXAM: PORTABLE CHEST 1 VIEW  COMPARISON:  None.  FINDINGS: The lungs are mildly hypoexpanded. Vascular crowding and vascular congestion are seen, with mildly increased interstitial markings, raising question for mild interstitial edema. There is no evidence of pleural effusion or pneumothorax.  The cardiomediastinal silhouette is borderline normal in size. The patient is status post median sternotomy. A pacemaker is noted overlying the left chest wall, with leads ending overlying the right atrium and right ventricle. No acute osseous abnormalities are seen. Scattered clips are seen overlying the left hemithorax.  IMPRESSION: Lungs mildly hypoexpanded. Vascular congestion noted, with mildly increased interstitial markings, raising question for mild interstitial edema.   Electronically Signed   By: Garald Balding M.D.   On: 12/22/2014 07:10     Review of Systems  Constitutional: Negative.   HENT: Negative.   Eyes: Negative.   Respiratory: Negative.   Cardiovascular: Negative.   Gastrointestinal: Negative.   Genitourinary: Negative.   Musculoskeletal: Negative.   Skin: Negative.   Neurological: Positive for tingling, sensory change and focal weakness. Negative for dizziness, tremors, speech change, seizures and loss of consciousness.  Psychiatric/Behavioral: Negative.    Blood pressure 93/45, pulse 84, temperature 98.4 F (36.9 C), temperature source Oral, resp. rate 18, height $RemoveBe'5\' 7"'[id number]$  (1.702 m), weight 83.643 kg (184 lb 6.4 oz), SpO2 96 %. Physical Exam  Nursing note and vitals reviewed. Constitutional: She appears well-developed and well-nourished. No distress.  HENT:  Head: Normocephalic and atraumatic.  Right Ear: External ear normal.  Left  Ear: External ear normal.  Nose: Nose normal.  Mouth/Throat: Oropharynx is clear and moist.  Eyes: Conjunctivae and EOM are normal. Pupils are equal, round, and reactive to light. No scleral icterus.  Neck: Normal range of motion. Neck supple.  Cardiovascular: Normal rate, regular rhythm, normal heart sounds and intact distal pulses.   Respiratory: Effort normal and breath sounds normal.  GI: Soft. Bowel sounds are normal.  Musculoskeletal: Normal range of motion. She exhibits no edema.  Neurological:  A+Ox3, mild dysathria, nl language PERRLA, EOMI, nl VF, face symmetric, tongue midline 5/5 R, 3/5 L, 2/5 B LE worse on L, high frequency tremor, nl tone FTN WNL, HTS limited by weakness 0/4 B, plantars mute B No neglect, decreased temp and pin on L  NIHSS 6  Skin: Skin is warm. She is not diaphoretic.  Psychiatric: She has a normal mood and affect.   CT of head personally reviewed by me and shows mild Crite matter changes  Assessment/Plan: 1.  R hemispheric infarct-  This could be thromboembolic due to carotid stenosis on that side vs. Small vessel disease;  MRI is  unable due to pacemaker but help could determine size of stroke which would allow [place] to make a plan on if surgery is needed or not.  This is symptomatic now with L weakness and numbness -  CTA of head and neck -  Echo pending -  Continue ASA, plavix and statin -  Pending lipids -  Permissive HTN for now until carotid disease is determined -  Needs therapy consults -  Will follow  Chrishun Scheer 12/22/2014, 10:05 PM
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
## 2014-12-22 NOTE — ED Notes (Signed)
Pt responding to questions but still slurring and tearful.  Daughter at bedside.

## 2014-12-23 DIAGNOSIS — I5032 Chronic diastolic (congestive) heart failure: Secondary | ICD-10-CM | POA: Diagnosis not present

## 2014-12-23 DIAGNOSIS — Z8249 Family history of ischemic heart disease and other diseases of the circulatory system: Secondary | ICD-10-CM | POA: Diagnosis not present

## 2014-12-23 DIAGNOSIS — Z951 Presence of aortocoronary bypass graft: Secondary | ICD-10-CM | POA: Diagnosis not present

## 2014-12-23 DIAGNOSIS — I639 Cerebral infarction, unspecified: Secondary | ICD-10-CM | POA: Diagnosis present

## 2014-12-23 DIAGNOSIS — Z7982 Long term (current) use of aspirin: Secondary | ICD-10-CM | POA: Diagnosis not present

## 2014-12-23 DIAGNOSIS — R410 Disorientation, unspecified: Secondary | ICD-10-CM | POA: Diagnosis present

## 2014-12-23 DIAGNOSIS — I6521 Occlusion and stenosis of right carotid artery: Secondary | ICD-10-CM | POA: Diagnosis not present

## 2014-12-23 DIAGNOSIS — R040 Epistaxis: Secondary | ICD-10-CM | POA: Diagnosis not present

## 2014-12-23 DIAGNOSIS — I2510 Atherosclerotic heart disease of native coronary artery without angina pectoris: Secondary | ICD-10-CM | POA: Diagnosis not present

## 2014-12-23 DIAGNOSIS — E1151 Type 2 diabetes mellitus with diabetic peripheral angiopathy without gangrene: Secondary | ICD-10-CM | POA: Diagnosis not present

## 2014-12-23 DIAGNOSIS — Z79899 Other long term (current) drug therapy: Secondary | ICD-10-CM | POA: Diagnosis not present

## 2014-12-23 DIAGNOSIS — E785 Hyperlipidemia, unspecified: Secondary | ICD-10-CM | POA: Diagnosis not present

## 2014-12-23 DIAGNOSIS — Z955 Presence of coronary angioplasty implant and graft: Secondary | ICD-10-CM | POA: Diagnosis not present

## 2014-12-23 DIAGNOSIS — Z90710 Acquired absence of both cervix and uterus: Secondary | ICD-10-CM | POA: Diagnosis not present

## 2014-12-23 DIAGNOSIS — I252 Old myocardial infarction: Secondary | ICD-10-CM | POA: Diagnosis not present

## 2014-12-23 DIAGNOSIS — Z66 Do not resuscitate: Secondary | ICD-10-CM | POA: Diagnosis not present

## 2014-12-23 DIAGNOSIS — E860 Dehydration: Secondary | ICD-10-CM | POA: Diagnosis not present

## 2014-12-23 DIAGNOSIS — I110 Hypertensive heart disease with heart failure: Secondary | ICD-10-CM | POA: Diagnosis not present

## 2014-12-23 DIAGNOSIS — G4733 Obstructive sleep apnea (adult) (pediatric): Secondary | ICD-10-CM | POA: Diagnosis not present

## 2014-12-23 LAB — GLUCOSE, CAPILLARY
GLUCOSE-CAPILLARY: 154 mg/dL — AB (ref 65–99)
GLUCOSE-CAPILLARY: 146 mg/dL — AB (ref 65–99)
Glucose-Capillary: 166 mg/dL — ABNORMAL HIGH (ref 65–99)
Glucose-Capillary: 136 mg/dL — ABNORMAL HIGH (ref 65–99)

## 2014-12-23 LAB — BASIC METABOLIC PANEL
Anion gap: 8 (ref 5–15)
BUN: 21 mg/dL — ABNORMAL HIGH (ref 6–20)
CHLORIDE: 107 mmol/L (ref 101–111)
CO2: 26 mmol/L (ref 22–32)
CREATININE: 0.81 mg/dL (ref 0.44–1.00)
Calcium: 9.4 mg/dL (ref 8.9–10.3)
GFR calc non Af Amer: >60 mL/min (ref >60)
GLUCOSE: 151 mg/dL — AB (ref 65–99)
Potassium: 4.2 mmol/L (ref 3.5–5.1)
Sodium: 141 mmol/L (ref 135–145)

## 2014-12-23 LAB — LIPID PANEL
CHOL/HDL RATIO: 7.9 ratio
CHOLESTEROL: 260 mg/dL — AB (ref 0–200)
HDL: 33 mg/dL — AB (ref >40)
LDL Cholesterol: 170 mg/dL — ABNORMAL HIGH (ref 0–99)
Triglycerides: 283 mg/dL — ABNORMAL HIGH (ref <150)
VLDL: 57 mg/dL — AB (ref 0–40)

## 2014-12-23 LAB — HEMOGLOBIN A1C: HEMOGLOBIN A1C: 7.7 % — AB (ref 4.0–6.0)

## 2014-12-23 MED ORDER — ASPIRIN 81 MG PO CHEW
81.0000 mg | CHEWABLE_TABLET | Freq: Every day | ORAL | Status: DC
  Administered 2014-12-24 – 2014-12-25 (×2): 81 mg via ORAL
  Filled 2014-12-23 (×3): qty 1

## 2014-12-23 MED ORDER — ENOXAPARIN SODIUM 40 MG/0.4ML ~~LOC~~ SOLN
40.0000 mg | SUBCUTANEOUS | Status: DC
  Administered 2014-12-23 – 2014-12-25 (×3): 40 mg via SUBCUTANEOUS
  Filled 2014-12-23 (×3): qty 0.4

## 2014-12-23 MED ORDER — ENOXAPARIN SODIUM 40 MG/0.4ML ~~LOC~~ SOLN: 40.0000 mg | SUBCUTANEOUS | Status: DC

## 2014-12-23 MED ORDER — ATORVASTATIN CALCIUM 20 MG PO TABS
40.0000 mg | ORAL_TABLET | Freq: Every day | ORAL | Status: DC
  Administered 2014-12-23 – 2014-12-25 (×3): 40 mg via ORAL
  Filled 2014-12-23 (×3): qty 2

## 2014-12-23 NOTE — Evaluation (Signed)
Occupational Therapy Evaluation Patient Details Name: Carla Dunn MRN: [id number] DOB: 05-01-1940 Today's Date: 12/23/2014    History of Present Illness This patient is a 74 year old female who came to [facility]  on 9 October after experiencing L facial numbness left sided weakness.  Patient has hx of HTN, diabetes, CAD, diastolic CHF, PVD, and sleep apnea.   Clinical Impression   This patient is a 74 year old female who came to [facility] stroke symptoms. She lives in a one story home with no steps to enter. She had been independent with ADL and functional mobility.  She now requires assist and would benefit from Occupational Therapy for ADL/functioal mobility training and L upper extremity restoration.      Follow Up Recommendations  SNF    Equipment Recommendations       Recommendations for Other Services       Precautions / Restrictions Precautions Precautions: Fall Restrictions Weight Bearing Restrictions: No      Mobility Bed Mobility                  Transfers                      Balance                                            ADL                                         General ADL Comments: Patient had been independent including driving. Testing activities of daily living was difficult as patient kept falling asleep.     Vision     Perception     Praxis      Pertinent Vitals/Pain       Hand Dominance Right   Extremity/Trunk Assessment Upper Extremity Assessment Upper Extremity Assessment:  (Bilateral strenght 3+/5 grip R 19 lbs, L 1 lb.  could not stay awake for sensory testing.)   Lower Extremity Assessment Lower Extremity Assessment: Defer to PT evaluation       Communication Communication Communication:  Health and safety inspector)   Cognition Arousal/Alertness: Lethargic                         General Comments       Exercises       Shoulder Instructions      Home Living Family/patient expects to be discharged to:: Skilled nursing facility Living Arrangements: Children;Other (Comment)                                      Prior Functioning/Environment Level of Independence: Independent             OT Diagnosis: Generalized weakness;Paresis   OT Problem List:     OT Treatment/Interventions: Self-care/ADL training    OT Goals(Current goals can be found in the care plan section) Acute Rehab OT Goals Patient Stated Goal: unable to state OT Goal Formulation: With patient/family Time For Goal Achievement: 01/06/15 Potential to Achieve Goals: Good  OT Frequency: Min 1X/week   Barriers to D/C:  Co-evaluation              End of Session Equipment Utilized During Treatment:  (stroke test kit)  Activity Tolerance: Patient limited by fatigue;Patient limited by lethargy Patient left: in bed;with call bell/phone within reach;with bed alarm set;with family/visitor present   Time: 7921-7837 OT Time Calculation (min): 12 min Charges:  OT General Charges $OT Visit: 1 Procedure OT Evaluation $Initial OT Evaluation Tier I: 1 Procedure G-Codes:    Myrene Galas, MS/OTR/L  12/23/2014, 10:30 AM

## 2014-12-23 NOTE — Plan of Care (Signed)
Problem: Discharge/Transitional Outcomes Goal: Other Discharge Outcomes/Goals Outcome: Progressing Pt is alert and oriented, c/o headache. Tylenol given for pain with improvement. C/o [place] sided numbness, tingling and weakness. 1 assist to the [facility]. Ice placed on area of extravasation on left arm, remains elevated. Daughter at bedside in the evening, supportive. VSS, resting quietly.

## 2014-12-23 NOTE — Progress Notes (Addendum)
[facility] [place] [facility]   PATIENT NAME: Carla Dunn    MR#:  [id number]  DATE OF BIRTH:  09-11-1940  SUBJECTIVE:  CHIEF COMPLAINT:   Chief Complaint  Patient presents with  . Altered Mental Status   still weakness on the left side  REVIEW OF SYSTEMS:  CONSTITUTIONAL: No fever, has weakness.  EYES: No blurred or double vision.  EARS, NOSE, AND THROAT: No tinnitus or ear pain.  RESPIRATORY: No cough, shortness of breath, wheezing or hemoptysis.  CARDIOVASCULAR: No chest pain, orthopnea, edema.  GASTROINTESTINAL: No nausea, vomiting, diarrhea or abdominal pain.  GENITOURINARY: No dysuria, hematuria.  ENDOCRINE: No polyuria, nocturia,  HEMATOLOGY: No anemia, easy bruising or bleeding SKIN: No rash or lesion. MUSCULOSKELETAL: No joint pain or arthritis.   NEUROLOGIC: has tingling, numbness and weakness on left side. No slurred speech or dysphagia. PSYCHIATRY: No anxiety or depression.   DRUG ALLERGIES:  No Known Allergies  VITALS:  Blood pressure 95/50, pulse 84, temperature 98.8 F (37.1 C), temperature source Oral, resp. rate 12, height 5\' 7"  (1.702 m), weight 83.643 kg (184 lb 6.4 oz), SpO2 98 %.  PHYSICAL EXAMINATION:  GENERAL:  74 y.o.-year-old patient lying in the bed with no acute distress.  EYES: Pupils equal, round, reactive to light and accommodation. No scleral icterus. Extraocular muscles intact.  HEENT: Head atraumatic, normocephalic. Oropharynx and nasopharynx clear.  NECK:  Supple, no jugular venous distention. No thyroid enlargement, no tenderness.  LUNGS: Normal breath sounds bilaterally, no wheezing, rales,rhonchi or crepitation. No use of accessory muscles of respiration.  CARDIOVASCULAR: S1, S2 normal. No murmurs, rubs, or gallops.  ABDOMEN: Soft, nontender, nondistended. Bowel sounds present. No organomegaly or mass.  EXTREMITIES: No pedal edema, cyanosis, or clubbing.  NEUROLOGIC: Cranial nerves II through XII are  intact. Muscle strength 4/5 in right extremities and 3/5 on left extremities. Sensation intact. Gait not checked.  PSYCHIATRIC: The patient is alert and oriented x 3.  SKIN: No obvious rash, lesion, or ulcer.    LABORATORY PANEL:   CBC  Recent Labs Lab 12/22/14 0613  WBC 7.4  HGB 14.9  HCT 45.9  PLT 230   ------------------------------------------------------------------------------------------------------------------  Chemistries   Recent Labs Lab 12/22/14 0613 12/23/14 0416  NA 140 141  K 4.4 4.2  CL 105 107  CO2 26 26  GLUCOSE 178* 151*  BUN 27* 21*  CREATININE 1.05* 0.81  CALCIUM 10.0 9.4  MG 2.4  --   AST 20  --   ALT 16  --   ALKPHOS 63  --   BILITOT 0.4  --    ------------------------------------------------------------------------------------------------------------------  Cardiac Enzymes  Recent Labs Lab 12/22/14 0613  TROPONINI <0.03   ------------------------------------------------------------------------------------------------------------------  RADIOLOGY:  Ct Angio Head W/cm &/or Wo Cm  12/23/2014   CLINICAL DATA:  Initial evaluation for acute left-sided numbness.  EXAM: CT ANGIOGRAPHY HEAD AND NECK  TECHNIQUE: Multidetector CT imaging of the head and neck was performed using the standard protocol during bolus administration of intravenous contrast. Multiplanar CT image reconstructions and MIPs were obtained to evaluate the vascular anatomy. Carotid stenosis measurements (when applicable) are obtained utilizing NASCET criteria, using the distal internal carotid diameter as the denominator.  CONTRAST:  75mL OMNIPAQUE IOHEXOL 350 MG/ML SOLN  COMPARISON:  Prior noncontrast head CT from earlier the same day.  FINDINGS: CTA NECK  Aortic arch: Visualize aortic arch is of normal caliber with prominent atheromatous plaque. Bovine arch present with common origin of the right brachiocephalic and left  common carotid artery. Heavy atheromatous plaque present  about the proximal great vessels bilaterally. 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 Only a faint string of contrast extends through this region (series 5, image 41). There is improved opacification of the left subclavian artery at the level of the left vertebral artery, suggesting at there may be a component of retrograde flow within the left vertebral artery reflecting subclavian steal. Heterogeneous atheromatous plaque present within knee right subclavian artery as well with multi focal moderate stenoses.  Right carotid system: Atheromatous plaque at the origin of the right common carotid artery with associated stenosis of approximately 30-40% by NASCET criteria. Heterogeneous plaque present distally throughout the mid right ICA. Prominent atheromatous plaque present about the right carotid bifurcation/proximal right ICA. Associated stenosis of approximately 60-70% by NASCET criteria within the proximal right ICA. Distally, the right ICA is well opacified to the skullbase.  Left carotid system: Atheromatous plaque at the origin of the left common carotid artery with associated stenosis of approximately 50% by NASCET criteria. Scattered plaque present distally within the left common carotid artery. Atheromatous plaque about the left carotid bifurcation/proximal left ICA. There is associated stenosis of approximately 50% by NASCET criteria within the proximal left ICA. Distally, the left ICA is well opacified to the skullbase.  Vertebral arteries:Both vertebral arteries arise from the subclavian arteries. Scattered plaque present at the origin of the right vertebral artery without definite high-grade stenosis. Vertebral arteries are otherwise well opacified to the skullbase without evidence for stenosis, dissection, or occlusion.  Skeleton: No acute osseous abnormality. No worrisome lytic or blastic osseous lesions. Mild degenerative  spondylolysis noted within the visualized spine.  Other neck: Visualized lungs demonstrate no acute process. No acute soft tissue abnormality within the neck. Thyroid gland within normal limits. No adenopathy.  CTA HEAD  Anterior circulation: Petrous segments of the internal carotid arteries are patent bilaterally. There is extensive smooth atheromatous plaque within the cavernous/ supraclinoid ICAs. There is secondary moderate to severe diffuse narrowing, slightly worse on the right. Left A1 segment patent. Right A1 segment appears to be hypoplastic or absent. Anterior communicating artery and anterior cerebral arteries grossly opacified.  M1 segments patent bilaterally without high-grade stenosis or occlusion. Moderate narrowing at the left MCA bifurcation. Distal MCA branches opacified bilaterally.  Posterior circulation: Scattered atheromatous plaque present within the V4 segments bilaterally with associated moderate to severe focal stenoses. Vertebral arteries are opacified to the vertebrobasilar junction. Posterior inferior cerebral arteries appear patent. Basilar artery patent. Superior cerebellar arteries patent proximally. Both posterior cerebral arteries arise from the basilar artery and are opacified to their distal aspects.  Venous sinuses: No venous sinus thrombosis.  Anatomic variants: No anatomic variant.  No aneurysm.  Delayed phase: No abnormal enhancement on delayed sequence.C  IMPRESSION: CTA NECK IMPRESSION:  1. Atheromatous plaque at the right carotid bifurcation/proximal right ICA with associated stenosis of approximately 60-70% by NASCET criteria. 2. Atheromatous plaque at the left carotid bifurcation/proximal left ICA with associated stenosis of approximately 50% by NASCET criteria. 3. 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 There is reconstitution distally at the origin of the left vertebral artery, which may be due to  retrograde flow within left vertebral artery. 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 4. Widely patent vertebral arteries.  CTA HEAD IMPRESSION:  1. No large or proximal arterial branch occlusion within the intracranial circulation.  2. Heavy atheromatous plaque within the cavernous/supraclinoid ICAs bilaterally with associated diffuse moderate to severe narrowing. 3. Focal atheromatous plaque within the distal left V4 segments bilaterally with associated moderate to severe stenoses. 4. Moderate nonocclusive focal stenosis at the distal left M1 segment/the left MCA bifurcation.   Electronically Signed   By: Rise Mu M.D.   On: 12/23/2014 00:32   Ct Head Wo Contrast  12/22/2014   CLINICAL DATA:  Awoke at 3:30 a.m., tachycardia, arrived from hotel. 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  EXAM: CT HEAD WITHOUT CONTRAST  TECHNIQUE: Contiguous axial images were obtained from the base of the skull through the vertex without intravenous contrast.  COMPARISON:  None.  FINDINGS: Mild noisy image quality.  The ventricles and sulci are normal for age. No intraparenchymal hemorrhage, mass effect nor midline shift. Patchy supratentorial Logiudice matter hypodensities are less than expected for patient's age and though non-specific suggest sequelae of chronic small vessel ischemic disease. No acute large vascular territory infarcts.  No abnormal extra-axial fluid collections. Basal cisterns are patent. Severe calcific atherosclerosis of the carotid siphons and included vertebral arteries.  No skull fracture. The included ocular globes and orbital contents are non-suspicious. Status post bilateral ocular lens implant. The mastoid aircells and included paranasal sinuses are well-aerated.  IMPRESSION: No acute intracranial process.  Severe calcific atherosclerosis. Involutional changes and mild chronic small vessel ischemic disease.   Electronically Signed   By: Awilda Metro M.D.   On: 12/22/2014 06:50   Ct Angio Neck W/cm &/or Wo/cm  12/23/2014   CLINICAL DATA:  Initial evaluation for acute left-sided numbness.  EXAM: CT ANGIOGRAPHY HEAD AND NECK  TECHNIQUE: Multidetector CT imaging of the head and neck was performed using the standard protocol during bolus administration of intravenous contrast. Multiplanar CT image reconstructions and MIPs were obtained to evaluate the vascular anatomy. Carotid stenosis measurements (when applicable) are obtained utilizing NASCET criteria, using the distal internal carotid diameter as the denominator.  CONTRAST:  75mL OMNIPAQUE IOHEXOL 350 MG/ML SOLN  COMPARISON:  Prior noncontrast head CT from earlier the same day.  FINDINGS: CTA NECK  Aortic arch: Visualize aortic arch is of normal caliber with prominent atheromatous plaque. Bovine arch present with common origin of the right brachiocephalic and left common carotid artery. Heavy atheromatous plaque present about the proximal great vessels bilaterally. 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 Only a faint string of contrast extends through this region (series 5, image 41). There is improved opacification of the left subclavian artery at the level of the left vertebral artery, suggesting at there may be a component of retrograde flow within the left vertebral artery reflecting subclavian steal. Heterogeneous atheromatous plaque present within knee right subclavian artery as well with multi focal moderate stenoses.  Right carotid system: Atheromatous plaque at the origin of the right common carotid artery with associated stenosis of approximately 30-40% by NASCET criteria. Heterogeneous plaque present distally throughout the mid right ICA. Prominent atheromatous plaque present about the right carotid bifurcation/proximal right ICA. Associated stenosis of approximately 60-70% by NASCET criteria within the  proximal right ICA. Distally, the right ICA is well opacified to the skullbase.  Left carotid system: Atheromatous plaque at the origin of the left common carotid artery with associated stenosis of approximately 50% by NASCET criteria. Scattered plaque present distally within the left common carotid artery. Atheromatous plaque about the left carotid bifurcation/proximal left ICA. There is associated stenosis of  approximately 50% by NASCET criteria within the proximal left ICA. Distally, the left ICA is well opacified to the skullbase.  Vertebral arteries:Both vertebral arteries arise from the subclavian arteries. Scattered plaque present at the origin of the right vertebral artery without definite high-grade stenosis. Vertebral arteries are otherwise well opacified to the skullbase without evidence for stenosis, dissection, or occlusion.  Skeleton: No acute osseous abnormality. No worrisome lytic or blastic osseous lesions. Mild degenerative spondylolysis noted within the visualized spine.  Other neck: Visualized lungs demonstrate no acute process. No acute soft tissue abnormality within the neck. Thyroid gland within normal limits. No adenopathy.  CTA HEAD  Anterior circulation: Petrous segments of the internal carotid arteries are patent bilaterally. There is extensive smooth atheromatous plaque within the cavernous/ supraclinoid ICAs. There is secondary moderate to severe diffuse narrowing, slightly worse on the right. Left A1 segment patent. Right A1 segment appears to be hypoplastic or absent. Anterior communicating artery and anterior cerebral arteries grossly opacified.  M1 segments patent bilaterally without high-grade stenosis or occlusion. Moderate narrowing at the left MCA bifurcation. Distal MCA branches opacified bilaterally.  Posterior circulation: Scattered atheromatous plaque present within the V4 segments bilaterally with associated moderate to severe focal stenoses. Vertebral arteries are opacified  to the vertebrobasilar junction. Posterior inferior cerebral arteries appear patent. Basilar artery patent. Superior cerebellar arteries patent proximally. Both posterior cerebral arteries arise from the basilar artery and are opacified to their distal aspects.  Venous sinuses: No venous sinus thrombosis.  Anatomic variants: No anatomic variant.  No aneurysm.  Delayed phase: No abnormal enhancement on delayed sequence.C  IMPRESSION: CTA NECK IMPRESSION:  1. Atheromatous plaque at the right carotid bifurcation/proximal right ICA with associated stenosis of approximately 60-70% by NASCET criteria. 2. Atheromatous plaque at the left carotid bifurcation/proximal left ICA with associated stenosis of approximately 50% by NASCET criteria. 3. 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 There is reconstitution distally at the origin of the left vertebral artery, which may be due to retrograde flow within left vertebral artery. 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 4. Widely patent vertebral arteries.  CTA HEAD IMPRESSION:  1. No large or proximal arterial branch occlusion within the intracranial circulation. 2. Heavy atheromatous plaque within the cavernous/supraclinoid ICAs bilaterally with associated diffuse moderate to severe narrowing. 3. Focal atheromatous plaque within the distal left V4 segments bilaterally with associated moderate to severe stenoses. 4. Moderate nonocclusive focal stenosis at the distal left M1 segment/the left MCA bifurcation.   Electronically Signed   By: Rise Mu M.D.   On: 12/23/2014 00:32   US Carotid Bilateral  12/22/2014   CLINICAL DATA:  Stroke.  EXAM: BILATERAL CAROTID DUPLEX ULTRASOUND  TECHNIQUE: Wallace Cullens scale imaging, color Doppler and duplex ultrasound were performed of bilateral carotid and vertebral arteries in the neck.  COMPARISON:  None.  FINDINGS: Criteria:  Quantification of carotid stenosis is based on velocity parameters that correlate the residual internal carotid diameter with NASCET-based stenosis levels, using the diameter of the distal internal carotid lumen as the denominator for stenosis measurement.  The following velocity measurements were obtained:  RIGHT  ICA:  132/31 cm/sec  CCA:  79/8 cm/sec  SYSTOLIC ICA/CCA RATIO:  1.7  DIASTOLIC ICA/CCA RATIO:  4.0  ECA:  195 cm/sec  LEFT  ICA:  116/37 cm/sec  CCA:  113/21 cm/sec  SYSTOLIC ICA/CCA RATIO:  1.0  DIASTOLIC ICA/CCA RATIO:  1.8  ECA:  148 cm/sec  RIGHT CAROTID ARTERY: Moderate eccentric calcified plaque formation is noted throughout the right common carotid artery. Eccentric calcified plaque is also noted in the right carotid bulb and proximal right internal carotid artery consistent with 50-69% stenosis based on ultrasound and Doppler criteria.  RIGHT VERTEBRAL ARTERY:  Antegrade flow is noted.  LEFT CAROTID ARTERY: Moderate eccentric calcified plaque is noted throughout the left common carotid artery. Moderate calcified plaque is noted in the left carotid bulb, with mild calcified plaque noted in the proximal left internal carotid artery. These findings are consistent less than 50% diameter stenosis based on ultrasound and Doppler criteria.  LEFT VERTEBRAL ARTERY:  Antegrade flow is noted.  IMPRESSION: Moderate eccentric calcified plaque is noted in the right carotid bulb and proximal right internal carotid artery consistent with 50-69% stenosis based on ultrasound and Doppler criteria.  Mild calcified eccentric plaque formation is noted in the proximal left internal carotid artery consistent with less than 50% diameter stenosis based on ultrasound and Doppler criteria.   Electronically Signed   By: Lupita Raider, M.D.   On: 12/22/2014 10:43   Dg Chest Portable 1 View  12/22/2014   CLINICAL DATA:  Acute onset of tachycardia. Patient not feeling right. Initial encounter.  EXAM: PORTABLE CHEST 1 VIEW   COMPARISON:  None.  FINDINGS: The lungs are mildly hypoexpanded. Vascular crowding and vascular congestion are seen, with mildly increased interstitial markings, raising question for mild interstitial edema. There is no evidence of pleural effusion or pneumothorax.  The cardiomediastinal silhouette is borderline normal in size. The patient is status post median sternotomy. A pacemaker is noted overlying the left chest wall, with leads ending overlying the right atrium and right ventricle. No acute osseous abnormalities are seen. Scattered clips are seen overlying the left hemithorax.  IMPRESSION: Lungs mildly hypoexpanded. Vascular congestion noted, with mildly increased interstitial markings, raising question for mild interstitial edema.   Electronically Signed   By: Roanna Raider M.D.   On: 12/22/2014 07:10    EKG:   Orders placed or performed during the hospital encounter of 12/22/14  . EKG 12-Lead  . EKG 12-Lead  . ED EKG  . ED EKG    ASSESSMENT AND PLAN:   Acute CVA (R hemispheric infarct). Continue ASA, plavix, zocor. Follow up echo. Follow up Dr. Katrinka Blazing.. 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 Continue PT, OT. Physical therapy evaluation suggests skilled nursing facility placement.  Carotid artery stenosis. carotid Dupplex: Right ICA 50-69%, left ICA<50%.  Continue Zocor. Vascular surgery consult.  Hyperlipidemia.  lipid panel shows LDL 170. Continue Zocor. HTN. BP is low, discontinued HTN meds. DM2. Continue Sliding scale.  Chronic diastolic CHF. Stable.  I discussed with Dr. Katrinka Blazing. All the records are reviewed and case discussed with Care Management/Social Workerr. Management plans discussed with the patient, her daughter and they are in agreement.  CODE STATUS: DO NOT RESUSCITATE  TOTAL TIME TAKING CARE OF THIS PATIENT: 38 minutes.   POSSIBLE D/C IN 3 DAYS, DEPENDING ON CLINICAL CONDITION.   Shaune Pollack M.D on 12/23/2014 at 1:36 PM  Between 7am to 6pm - Pager -  [phone]  After 6pm go to [email or web address] - password [email or web address] [facility]  [facility]  Office  [phone]  CC: Primary care physician; Michail Sermon, MD

## 2014-12-23 NOTE — Progress Notes (Signed)
PT Cancellation Note  Patient Details Name: Carla Dunn MRN: [id number] DOB: 10/05/1940   Cancelled Treatment:    Reason Eval/Treat Not Completed: Medical issues which prohibited therapy;Fatigue/lethargy limiting ability to participate (Noted results of CTA this AM; will hold PT session until CTA results reviewed by neurology and cleared for additional activity.)   Lajoya Dombek H. Manson Passey, PT, DPT, NCS 12/23/2014, 10:38 AM [phone]

## 2014-12-23 NOTE — Clinical Social Work Note (Signed)
Clinical Social Work Assessment  Patient Details  Name: Carla Dunn MRN: [id number] Date of Birth: [birth year]  Date of referral:  12/23/14               Reason for consult:  Facility Placement                Permission sought to share information with:    Permission granted to share information::     Name::        Agency::     Relationship::     Contact Information:     Housing/Transportation Living arrangements for the past 2 months:  Single Family Home Source of Information:  Adult Children Patient Interpreter Needed:  None Criminal Activity/Legal Involvement Pertinent to Current Situation/Hospitalization:  No - Comment as needed Significant Relationships:  Adult Children Lives with:  Self Do you feel safe going back to the place where you live?  Yes Need for family participation in patient care:  Yes (Comment)  Care giving concerns:  Patient lives alone in her home which is in [place].   Social Worker assessment / plan:  CSW informed that PT has recommended STR due to acute CVA. CSW met with patient's daughter: Carla Dunn: [phone] and attempted to meet with patient in patient's hospital room this afternoon. Patient continued sleeping and would not participate in the conversation between myself and her daughter. Patient's daughter explained that she and patient were in the area visiting for patient's birthday and that yesterday, on her birthday, she had a cva. She stated patient lives in [place] but that she and her sister live in [place] and that if we can find a facility in [place] they would prefer that. CSW explained the placement process and will send patient's referral to both [place] and [place]. Patient's daughter is aware that if patient receives a bed offer within [facility] that she will need to transport patient and she is in agreement.   Employment status:  Retired Forensic scientist:  Medicare PT Recommendations:  [place] / Referral to community resources:  [place]  Patient/Family's Response to care:  Patient's daughter expressed appreciation for CSW assistance.  Patient/Family's Understanding of and Emotional Response to Diagnosis, Current Treatment, and Prognosis:  Patient was sleeping and would not awaken during my conversation with her daughter. This hospitalization was very unexpected and they are both away from home. CSW to provide support.   Emotional Assessment Appearance:  Appears stated age Attitude/Demeanor/Rapport:  Unable to Assess Affect (typically observed):  Unable to Assess Orientation:  Oriented to Self, Oriented to Place, Oriented to  Time, Oriented to Situation Alcohol / Substance use:  Not Applicable Psych involvement (Current and /or in the community):  No (Comment)  Discharge Needs  Concerns to be addressed:  Care Coordination Readmission within the last 30 days:  No Current discharge risk:  None Barriers to Discharge:  No Barriers Identified   Shela Leff, LCSW 12/23/2014, 12:56 PM

## 2014-12-23 NOTE — Plan of Care (Signed)
Problem: Discharge/Transitional Outcomes Goal: Other Discharge Outcomes/Goals Outcome: Progressing Plan of Care Progressing to Goal: Patient has no complaints of pain. VSS. Tolerating diet well. NIH is 6. Neuro checks performed. Monitoring telemetry.Family at bedside.

## 2014-12-23 NOTE — Progress Notes (Signed)
NEUROLOGY NOTE  S: Pt feels fine and stronger today, still has some numbness on the L  ROS neg x 8 systems  O: 98.8    95/50   84   12 No distress, nl weight Normocephalic, oropharynx clear Supple, no JVD CTA B, no wheezing RRR, no murmur No C/C/E  Alert and oriented x 32 not time, nl speech and language PERRLA, EOMI, face symmetric 5/5 R, drift on L  A/P: 1.  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  Intracranial atherosclerosis-  Pt is now on max medical management 3.  R carotid stenosis-  Symptomatic -  Needs vascular surgery consult for possible R CEA in 1-2 weeks -  Continue ASA, plavix and statin -  Goal LDL < 70 -  Permissive HTN ok now until after CEA -  Will sign off, please call questions -  Needs to f/u with [facility] in 2-3 months

## 2014-12-23 NOTE — Care Management Important Message (Signed)
Important Message  Patient Details  Name: Carla Dunn MRN: [id number] Date of Birth: 02/15/1941   Medicare Important Message Given:  Yes-second notification given    Gwenette Greet, RN 12/23/2014, 12:20 PM

## 2014-12-23 NOTE — Consult Note (Signed)
[facility] Vascular Consult Note  MRN : [id number]  Carla Dunn is a 74 y.o. (1941/01/10) female who presents with chief complaint of  Chief Complaint  Patient presents with  . Altered Mental Status  .  History of Present Illness: Patient admitted yesterday morning with left arm and leg weakness and numbness as well as left facial droop. Her facial droop still present although she says it has improved. Her left leg is basically returned to baseline. Her left arm does still have some numbness and tingling, but her strength appears to be intact at this point. She reports a previous history of left subclavian artery surgery for stenosis. It is not clear when her carotids were last checked. She denies any temporary monocular blindness or speech or swallowing difficulty. As part of her workup for stroke, carotid duplex suggested moderate right carotid artery stenosis and mild left carotid artery stenosis. This was followed by CT angiogram which I have independently reviewed. This demonstrates what appears to be a 65-70% calcified right internal carotid artery stenosis and about a 50% calcified left carotid artery stenosis. This is in association with right hemispheric stroke. Neurology has seen the patient and she is on maximal medical therapy from a carotid standpoint and they have recommended intervention with carotid endarterectomy in 1-2 weeks. We are consult for further evaluation and treatment.  Current Facility-Administered Medications  Medication Dose Route Frequency Provider Last Rate Last Dose  . acetaminophen (TYLENOL) tablet 650 mg  650 mg Oral Q6H PRN Auburn Bilberry, MD   650 mg at 12/22/14 1952  . aspirin chewable tablet 81 mg  81 mg Oral Daily Shaune Pollack, MD   81 mg at 12/23/14 1442  . atorvastatin (LIPITOR) tablet 40 mg  40 mg Oral q1800 Shaune Pollack, MD      . buPROPion (WELLBUTRIN XL) 24 hr tablet 150 mg  150 mg Oral Daily Shaune Pollack, MD   150 mg at 12/23/14 0831   . clonazePAM (KLONOPIN) tablet 1 mg  1 mg Oral TID Shaune Pollack, MD   1 mg at 12/23/14 4098  . clopidogrel (PLAVIX) tablet 75 mg  75 mg Oral Daily Shaune Pollack, MD   75 mg at 12/23/14 0831  . docusate sodium (COLACE) capsule 200 mg  200 mg Oral QHS Shaune Pollack, MD   200 mg at 12/22/14 2218  . enoxaparin (LOVENOX) injection 40 mg  40 mg Subcutaneous Q24H Shaune Pollack, MD   40 mg at 12/22/14 1954  . ezetimibe (ZETIA) tablet 10 mg  10 mg Oral QPC supper Shaune Pollack, MD   10 mg at 12/22/14 1717  . fesoterodine (TOVIAZ) tablet 4 mg  4 mg Oral Daily Shaune Pollack, MD   4 mg at 12/22/14 1255  . insulin aspart (novoLOG) injection 0-5 Units  0-5 Units Subcutaneous QHS Shaune Pollack, MD   0 Units at 12/22/14 2200  . insulin aspart (novoLOG) injection 0-9 Units  0-9 Units Subcutaneous TID WC Shaune Pollack, MD   2 Units at 12/23/14 1156  . nitroGLYCERIN (NITROSTAT) SL tablet 0.4 mg  0.4 mg Sublingual Q5 min PRN Shaune Pollack, MD      . ranolazine (RANEXA) 12 hr tablet 500 mg  500 mg Oral BID Shaune Pollack, MD   500 mg at 12/23/14 1191  . senna-docusate (Senokot-S) tablet 1 tablet  1 tablet Oral QHS PRN Shaune Pollack, MD      . traMADol Janean Sark) tablet 50 mg  50 mg Oral Q6H PRN Shaune Pollack,  MD   50 mg at 12/22/14 1539  . valACYclovir (VALTREX) tablet 500 mg  500 mg Oral BID Shaune Pollack, MD   500 mg at 12/23/14 5366  . venlafaxine XR (EFFEXOR-XR) 24 hr capsule 75 mg  75 mg Oral Q breakfast Shaune Pollack, MD   75 mg at 12/23/14 4403  . zolpidem (AMBIEN) tablet 5 mg  5 mg Oral QHS PRN Shaune Pollack, MD        Past Medical History  Diagnosis Date  . Diabetes mellitus without complication (HCC)   . Tremors of nervous system   . Pacemaker   . Peripheral vascular disease (HCC)   . CHF (congestive heart failure) (HCC)   . Sleep apnea   . Myocardial infarction (HCC)   . Coronary artery disease   . Depression   . Second degree heart block   . Hypertension     Past Surgical History  Procedure Laterality Date  . Coronary artery bypass graft    .  Coronary stent placement    . Abdominal hysterectomy    . Insert / replace / remove pacemaker      Social History Social History  Substance Use Topics  . Smoking status: Never Smoker   . Smokeless tobacco: Never Used  . Alcohol Use: None  Lives in [place] area  Family History Family History  Problem Relation Age of Onset  . Heart attack Mother   . Heart attack Father   . Heart attack Brother   no bleeding or clotting disorders  No Known Allergies   REVIEW OF SYSTEMS (Negative unless checked)  Constitutional: Weight loss  Fever  Chills Cardiac: Chest pain   Chest pressure   Palpitations   Shortness of breath when laying flat   Shortness of breath at rest   Shortness of breath with exertion. Vascular:  Pain in legs with walking   Pain in legs at rest   Pain in legs when laying flat   Claudication   Pain in feet when walking  Pain in feet at rest  Pain in feet when laying flat   History of DVT   Phlebitis   Swelling in legs   Varicose veins   Non-healing ulcers Pulmonary:   Uses home oxygen   Productive cough   Hemoptysis   Wheeze  COPD   Asthma Neurologic:  Dizziness  Blackouts   Seizures   History of stroke   History of TIA  Aphasia   Temporary blindness   Dysphagia   Weakness or numbness in arms   Weakness or numbness in legs Musculoskeletal:  Arthritis   Joint swelling   Joint pain   Low back pain Hematologic:  Easy bruising  Easy bleeding   Hypercoagulable state   Anemic  Hepatitis Gastrointestinal:  Blood in stool   Vomiting blood  Gastroesophageal reflux/heartburn   Difficulty swallowing. Genitourinary:  Chronic kidney disease   Difficult urination  Frequent urination  Burning with urination   Blood in urine Skin:  Rashes   Ulcers   Wounds Psychological:  History of anxiety    History of major depression.  Physical Examination  Filed Vitals:    12/22/14 2221 12/23/14 0049 12/23/14 0520 12/23/14 1258  BP: 115/68 123/65 107/47 95/50  Pulse: 86 79 85 84  Temp: 98.5 F (36.9 C)  97.7 F (36.5 C) 98.8 F (37.1 C)  TempSrc: Oral  Oral Oral  Resp: 12 12    Height:      Weight:  SpO2: 99% 99% 98% 98%   Body mass index is 28.87 kg/(m^2). Gen:  WD/WN, NAD Head: [place]/AT, No temporalis wasting. Prominent temp pulse not noted. Ear/Nose/Throat: Hearing grossly intact, nares w/o erythema or drainage, oropharynx w/o Erythema/Exudate Eyes: PERRLA, EOMI.  Neck: Supple, no nuchal rigidity.  No JVD.  Pulmonary:  Good air movement, clear to auscultation bilaterally.  Cardiac: RRR, normal S1, S2, no Murmurs, rubs or gallops. Vascular: right carotid bruit Vessel Right Left  Radial 2+ 1+                                   Gastrointestinal: soft, non-tender/non-distended. No guarding/reflex. No masses, surgical incisions, or scars. Musculoskeletal: M/S 5/5 throughout.  Extremities without ischemic changes.  No deformity or atrophy. No edema. Neurologic: CN 2-12 intact. Pain and light touch intact in extremities.  Symmetrical.  Speech is fluent. Motor exam as listed above. Mild left facial droop present.   Psychiatric: Judgment intact, Mood & affect appropriate for pt's clinical situation. Dermatologic: No rashes or ulcers noted.  No cellulitis or open wounds. Lymph : No Cervical, Axillary, or Inguinal lymphadenopathy.       CBC Lab Results  Component Value Date   WBC 7.4 12/22/2014   HGB 14.9 12/22/2014   HCT 45.9 12/22/2014   MCV 91.8 12/22/2014   PLT 230 12/22/2014    BMET    Component Value Date/Time   NA 141 12/23/2014 0416   K 4.2 12/23/2014 0416   CL 107 12/23/2014 0416   CO2 26 12/23/2014 0416   GLUCOSE 151* 12/23/2014 0416   BUN 21* 12/23/2014 0416   CREATININE 0.81 12/23/2014 0416   CALCIUM 9.4 12/23/2014 0416   GFRNONAA >60 12/23/2014 0416   GFRAA >60 12/23/2014 0416   Estimated Creatinine Clearance:  67.7 mL/min (by C-G formula based on Cr of 0.81).  COAG No results found for: INR, PROTIME  Radiology Ct Angio Head W/cm &/or Wo Cm  12/23/2014   CLINICAL DATA:  Initial evaluation for acute left-sided numbness.  EXAM: CT ANGIOGRAPHY HEAD AND NECK  TECHNIQUE: Multidetector CT imaging of the head and neck was performed using the standard protocol during bolus administration of intravenous contrast. Multiplanar CT image reconstructions and MIPs were obtained to evaluate the vascular anatomy. Carotid stenosis measurements (when applicable) are obtained utilizing NASCET criteria, using the distal internal carotid diameter as the denominator.  CONTRAST:  75mL OMNIPAQUE IOHEXOL 350 MG/ML SOLN  COMPARISON:  Prior noncontrast head CT from earlier the same day.  FINDINGS: CTA NECK  Aortic arch: Visualize aortic arch is of normal caliber with prominent atheromatous plaque. Bovine arch present with common origin of the right brachiocephalic and left common carotid artery. Heavy atheromatous plaque present about the proximal great vessels bilaterally. 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 Only a faint string of contrast extends through this region (series 5, image 41). There is improved opacification of the left subclavian artery at the level of the left vertebral artery, suggesting at there may be a component of retrograde flow within the left vertebral artery reflecting subclavian steal. Heterogeneous atheromatous plaque present within knee right subclavian artery as well with multi focal moderate stenoses.  Right carotid system: Atheromatous plaque at the origin of the right common carotid artery with associated stenosis of approximately 30-40% by NASCET criteria. Heterogeneous plaque present distally throughout the mid right ICA. Prominent atheromatous  plaque present about the right carotid bifurcation/proximal right ICA. Associated  stenosis of approximately 60-70% by NASCET criteria within the proximal right ICA. Distally, the right ICA is well opacified to the skullbase.  Left carotid system: Atheromatous plaque at the origin of the left common carotid artery with associated stenosis of approximately 50% by NASCET criteria. Scattered plaque present distally within the left common carotid artery. Atheromatous plaque about the left carotid bifurcation/proximal left ICA. There is associated stenosis of approximately 50% by NASCET criteria within the proximal left ICA. Distally, the left ICA is well opacified to the skullbase.  Vertebral arteries:Both vertebral arteries arise from the subclavian arteries. Scattered plaque present at the origin of the right vertebral artery without definite high-grade stenosis. Vertebral arteries are otherwise well opacified to the skullbase without evidence for stenosis, dissection, or occlusion.  Skeleton: No acute osseous abnormality. No worrisome lytic or blastic osseous lesions. Mild degenerative spondylolysis noted within the visualized spine.  Other neck: Visualized lungs demonstrate no acute process. No acute soft tissue abnormality within the neck. Thyroid gland within normal limits. No adenopathy.  CTA HEAD  Anterior circulation: Petrous segments of the internal carotid arteries are patent bilaterally. There is extensive smooth atheromatous plaque within the cavernous/ supraclinoid ICAs. There is secondary moderate to severe diffuse narrowing, slightly worse on the right. Left A1 segment patent. Right A1 segment appears to be hypoplastic or absent. Anterior communicating artery and anterior cerebral arteries grossly opacified.  M1 segments patent bilaterally without high-grade stenosis or occlusion. Moderate narrowing at the left MCA bifurcation. Distal MCA branches opacified bilaterally.  Posterior circulation: Scattered atheromatous plaque present within the V4 segments bilaterally with associated  moderate to severe focal stenoses. Vertebral arteries are opacified to the vertebrobasilar junction. Posterior inferior cerebral arteries appear patent. Basilar artery patent. Superior cerebellar arteries patent proximally. Both posterior cerebral arteries arise from the basilar artery and are opacified to their distal aspects.  Venous sinuses: No venous sinus thrombosis.  Anatomic variants: No anatomic variant.  No aneurysm.  Delayed phase: No abnormal enhancement on delayed sequence.C  IMPRESSION: CTA NECK IMPRESSION:  1. Atheromatous plaque at the right carotid bifurcation/proximal right ICA with associated stenosis of approximately 60-70% by NASCET criteria. 2. Atheromatous plaque at the left carotid bifurcation/proximal left ICA with associated stenosis of approximately 50% by NASCET criteria. 3. 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 There is reconstitution distally at the origin of the left vertebral artery, which may be due to retrograde flow within left vertebral artery. 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 4. Widely patent vertebral arteries.  CTA HEAD IMPRESSION:  1. No large or proximal arterial branch occlusion within the intracranial circulation. 2. Heavy atheromatous plaque within the cavernous/supraclinoid ICAs bilaterally with associated diffuse moderate to severe narrowing. 3. Focal atheromatous plaque within the distal left V4 segments bilaterally with associated moderate to severe stenoses. 4. Moderate nonocclusive focal stenosis at the distal left M1 segment/the left MCA bifurcation.   Electronically Signed   By: Rise Mu M.D.   On: 12/23/2014 00:32   Ct Head Wo Contrast  12/22/2014   CLINICAL DATA:  Awoke at 3:30 a.m., tachycardia, arrived from hotel. 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  EXAM: CT HEAD WITHOUT CONTRAST  TECHNIQUE: Contiguous axial images were  obtained from the base of the skull through the vertex without intravenous contrast.  COMPARISON:  None.  FINDINGS: Mild noisy image quality.  The ventricles and sulci  are normal for age. No intraparenchymal hemorrhage, mass effect nor midline shift. Patchy supratentorial Niehaus matter hypodensities are less than expected for patient's age and though non-specific suggest sequelae of chronic small vessel ischemic disease. No acute large vascular territory infarcts.  No abnormal extra-axial fluid collections. Basal cisterns are patent. Severe calcific atherosclerosis of the carotid siphons and included vertebral arteries.  No skull fracture. The included ocular globes and orbital contents are non-suspicious. Status post bilateral ocular lens implant. The mastoid aircells and included paranasal sinuses are well-aerated.  IMPRESSION: No acute intracranial process.  Severe calcific atherosclerosis. Involutional changes and mild chronic small vessel ischemic disease.   Electronically Signed   By: Awilda Metro M.D.   On: 12/22/2014 06:50   Ct Angio Neck W/cm &/or Wo/cm  12/23/2014   CLINICAL DATA:  Initial evaluation for acute left-sided numbness.  EXAM: CT ANGIOGRAPHY HEAD AND NECK  TECHNIQUE: Multidetector CT imaging of the head and neck was performed using the standard protocol during bolus administration of intravenous contrast. Multiplanar CT image reconstructions and MIPs were obtained to evaluate the vascular anatomy. Carotid stenosis measurements (when applicable) are obtained utilizing NASCET criteria, using the distal internal carotid diameter as the denominator.  CONTRAST:  75mL OMNIPAQUE IOHEXOL 350 MG/ML SOLN  COMPARISON:  Prior noncontrast head CT from earlier the same day.  FINDINGS: CTA NECK  Aortic arch: Visualize aortic arch is of normal caliber with prominent atheromatous plaque. Bovine arch present with common origin of the right brachiocephalic and left common carotid artery. Heavy atheromatous  plaque present about the proximal great vessels bilaterally. 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 Only a faint string of contrast extends through this region (series 5, image 41). There is improved opacification of the left subclavian artery at the level of the left vertebral artery, suggesting at there may be a component of retrograde flow within the left vertebral artery reflecting subclavian steal. Heterogeneous atheromatous plaque present within knee right subclavian artery as well with multi focal moderate stenoses.  Right carotid system: Atheromatous plaque at the origin of the right common carotid artery with associated stenosis of approximately 30-40% by NASCET criteria. Heterogeneous plaque present distally throughout the mid right ICA. Prominent atheromatous plaque present about the right carotid bifurcation/proximal right ICA. Associated stenosis of approximately 60-70% by NASCET criteria within the proximal right ICA. Distally, the right ICA is well opacified to the skullbase.  Left carotid system: Atheromatous plaque at the origin of the left common carotid artery with associated stenosis of approximately 50% by NASCET criteria. Scattered plaque present distally within the left common carotid artery. Atheromatous plaque about the left carotid bifurcation/proximal left ICA. There is associated stenosis of approximately 50% by NASCET criteria within the proximal left ICA. Distally, the left ICA is well opacified to the skullbase.  Vertebral arteries:Both vertebral arteries arise from the subclavian arteries. Scattered plaque present at the origin of the right vertebral artery without definite high-grade stenosis. Vertebral arteries are otherwise well opacified to the skullbase without evidence for stenosis, dissection, or occlusion.  Skeleton: No acute osseous abnormality. No worrisome lytic or blastic osseous lesions.  Mild degenerative spondylolysis noted within the visualized spine.  Other neck: Visualized lungs demonstrate no acute process. No acute soft tissue abnormality within the neck. Thyroid gland within normal limits. No adenopathy.  CTA HEAD  Anterior circulation: Petrous segments of the internal carotid arteries are patent bilaterally. There is extensive smooth atheromatous plaque within the  cavernous/ supraclinoid ICAs. There is secondary moderate to severe diffuse narrowing, slightly worse on the right. Left A1 segment patent. Right A1 segment appears to be hypoplastic or absent. Anterior communicating artery and anterior cerebral arteries grossly opacified.  M1 segments patent bilaterally without high-grade stenosis or occlusion. Moderate narrowing at the left MCA bifurcation. Distal MCA branches opacified bilaterally.  Posterior circulation: Scattered atheromatous plaque present within the V4 segments bilaterally with associated moderate to severe focal stenoses. Vertebral arteries are opacified to the vertebrobasilar junction. Posterior inferior cerebral arteries appear patent. Basilar artery patent. Superior cerebellar arteries patent proximally. Both posterior cerebral arteries arise from the basilar artery and are opacified to their distal aspects.  Venous sinuses: No venous sinus thrombosis.  Anatomic variants: No anatomic variant.  No aneurysm.  Delayed phase: No abnormal enhancement on delayed sequence.C  IMPRESSION: CTA NECK IMPRESSION:  1. Atheromatous plaque at the right carotid bifurcation/proximal right ICA with associated stenosis of approximately 60-70% by NASCET criteria. 2. Atheromatous plaque at the left carotid bifurcation/proximal left ICA with associated stenosis of approximately 50% by NASCET criteria. 3. 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 There is reconstitution distally at the origin of the left vertebral artery, which  may be due to retrograde flow within left vertebral artery. 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 4. Widely patent vertebral arteries.  CTA HEAD IMPRESSION:  1. No large or proximal arterial branch occlusion within the intracranial circulation. 2. Heavy atheromatous plaque within the cavernous/supraclinoid ICAs bilaterally with associated diffuse moderate to severe narrowing. 3. Focal atheromatous plaque within the distal left V4 segments bilaterally with associated moderate to severe stenoses. 4. Moderate nonocclusive focal stenosis at the distal left M1 segment/the left MCA bifurcation.   Electronically Signed   By: Rise Mu M.D.   On: 12/23/2014 00:32   US Carotid Bilateral  12/22/2014   CLINICAL DATA:  Stroke.  EXAM: BILATERAL CAROTID DUPLEX ULTRASOUND  TECHNIQUE: Wallace Cullens scale imaging, color Doppler and duplex ultrasound were performed of bilateral carotid and vertebral arteries in the neck.  COMPARISON:  None.  FINDINGS: Criteria: Quantification of carotid stenosis is based on velocity parameters that correlate the residual internal carotid diameter with NASCET-based stenosis levels, using the diameter of the distal internal carotid lumen as the denominator for stenosis measurement.  The following velocity measurements were obtained:  RIGHT  ICA:  132/31 cm/sec  CCA:  79/8 cm/sec  SYSTOLIC ICA/CCA RATIO:  1.7  DIASTOLIC ICA/CCA RATIO:  4.0  ECA:  195 cm/sec  LEFT  ICA:  116/37 cm/sec  CCA:  113/21 cm/sec  SYSTOLIC ICA/CCA RATIO:  1.0  DIASTOLIC ICA/CCA RATIO:  1.8  ECA:  148 cm/sec  RIGHT CAROTID ARTERY: Moderate eccentric calcified plaque formation is noted throughout the right common carotid artery. Eccentric calcified plaque is also noted in the right carotid bulb and proximal right internal carotid artery consistent with 50-69% stenosis based on ultrasound and Doppler criteria.  RIGHT VERTEBRAL ARTERY:  Antegrade flow is noted.  LEFT CAROTID ARTERY:  Moderate eccentric calcified plaque is noted throughout the left common carotid artery. Moderate calcified plaque is noted in the left carotid bulb, with mild calcified plaque noted in the proximal left internal carotid artery. These findings are consistent less than 50% diameter stenosis based on ultrasound and Doppler criteria.  LEFT VERTEBRAL ARTERY:  Antegrade flow is noted.  IMPRESSION: Moderate eccentric calcified plaque is noted in the right carotid bulb and proximal right  internal carotid artery consistent with 50-69% stenosis based on ultrasound and Doppler criteria.  Mild calcified eccentric plaque formation is noted in the proximal left internal carotid artery consistent with less than 50% diameter stenosis based on ultrasound and Doppler criteria.   Electronically Signed   By: Lupita Raider, M.D.   On: 12/22/2014 10:43   Dg Chest Portable 1 View  12/22/2014   CLINICAL DATA:  Acute onset of tachycardia. Patient not feeling right. Initial encounter.  EXAM: PORTABLE CHEST 1 VIEW  COMPARISON:  None.  FINDINGS: The lungs are mildly hypoexpanded. Vascular crowding and vascular congestion are seen, with mildly increased interstitial markings, raising question for mild interstitial edema. There is no evidence of pleural effusion or pneumothorax.  The cardiomediastinal silhouette is borderline normal in size. The patient is status post median sternotomy. A pacemaker is noted overlying the left chest wall, with leads ending overlying the right atrium and right ventricle. No acute osseous abnormalities are seen. Scattered clips are seen overlying the left hemithorax.  IMPRESSION: Lungs mildly hypoexpanded. Vascular congestion noted, with mildly increased interstitial markings, raising question for mild interstitial edema.   Electronically Signed   By: Roanna Raider M.D.   On: 12/22/2014 07:10      Assessment/Plan 1. Moderate to high-grade right carotid artery stenosis with recent stroke. Would agree  with neurology assessment and would generally recommend carotid intervention for a symptomatic lesion in 1-2 weeks. I have independently reviewed her CT angiogram, and her calcific lesion may be best served with carotid endarterectomy over carotid artery stenting. The patient voices her understanding, but is not from here and would like to have her surgery performed back in [place] which is reasonable. Images and records be sent to her cardiologist and the vascular group in [place] for further evaluation and plan treatment. Continue maximal medical therapy as currently 2. Stroke. Right hemispheric. Mild residual deficits. Would recommend rehabilitation after discharge for physical therapy and occupational therapy either inpatient or outpatient. 3. Left subclavian artery stenosis. Apparently has had a previous surgical attempt at repair of this. Not particularly symptomatic currently, and carotid disease would take precedence. 4. CAD.  S/p CABG.  Likely needs cardiac eval prior to surgery.   Shauniece Kwan, MD  12/23/2014 5:31 PM

## 2014-12-24 LAB — CBC WITH DIFFERENTIAL/PLATELET
BASOS PCT: 0 %
Basophils Absolute: 0 10*3/uL (ref 0–0.1)
EOS ABS: 0.1 10*3/uL (ref 0–0.7)
Eosinophils Relative: 1 %
HCT: 37 % (ref 35.0–47.0)
Hemoglobin: 12.4 g/dL (ref 12.0–16.0)
Lymphocytes Relative: 41 %
Lymphs Abs: 2.6 10*3/uL (ref 1.0–3.6)
MCH: 30.4 pg (ref 26.0–34.0)
MCHC: 33.5 g/dL (ref 32.0–36.0)
MCV: 90.8 fL (ref 80.0–100.0)
MONO ABS: 0.7 10*3/uL (ref 0.2–0.9)
MONOS PCT: 11 %
Neutro Abs: 2.9 10*3/uL (ref 1.4–6.5)
Neutrophils Relative %: 47 %
Platelets: 172 10*3/uL (ref 150–440)
RBC: 4.08 MIL/uL (ref 3.80–5.20)
RDW: 14.9 % — AB (ref 11.5–14.5)
WBC: 6.3 10*3/uL (ref 3.6–11.0)

## 2014-12-24 LAB — CREATININE, SERUM
CREATININE: 0.85 mg/dL (ref 0.44–1.00)
GFR calc Af Amer: >60 mL/min (ref >60)

## 2014-12-24 LAB — GLUCOSE, CAPILLARY
GLUCOSE-CAPILLARY: 138 mg/dL — AB (ref 65–99)
Glucose-Capillary: 138 mg/dL — ABNORMAL HIGH (ref 65–99)
Glucose-Capillary: 168 mg/dL — ABNORMAL HIGH (ref 65–99)
Glucose-Capillary: 123 mg/dL — ABNORMAL HIGH (ref 65–99)

## 2014-12-24 NOTE — Progress Notes (Signed)
[facility] - [place] at [facility]   PATIENT NAME: Carla Dunn    MR#:  [id number]  DATE OF BIRTH:  12/03/1940  SUBJECTIVE:  CHIEF COMPLAINT:   Chief Complaint  Patient presents with  . Altered Mental Status   better weakness on the left side, but has small nosebleeding this morning.  REVIEW OF SYSTEMS:  CONSTITUTIONAL: No fever, has weakness.  EYES: No blurred or double vision.  EARS, NOSE, AND THROAT: No tinnitus or ear pain. But has epistaxis this morning. RESPIRATORY: No cough, shortness of breath, wheezing or hemoptysis.  CARDIOVASCULAR: No chest pain, orthopnea, edema.  GASTROINTESTINAL: No nausea, vomiting, diarrhea or abdominal pain.  GENITOURINARY: No dysuria, hematuria.  ENDOCRINE: No polyuria, nocturia,  HEMATOLOGY: No anemia, easy bruising or bleeding SKIN: No rash or lesion. MUSCULOSKELETAL: No joint pain or arthritis.   NEUROLOGIC: has tingling, numbness and weakness on left side. No slurred speech or dysphagia. PSYCHIATRY: No anxiety or depression.   DRUG ALLERGIES:  No Known Allergies  VITALS:  Blood pressure 129/66, pulse 95, temperature 97.8 F (36.6 C), temperature source Oral, resp. rate 18, height 5\' 7"  (1.702 m), weight 83.643 kg (184 lb 6.4 oz), SpO2 96 %.  PHYSICAL EXAMINATION:  GENERAL:  73 y.o.-year-old patient lying in the bed with no acute distress. Sitting in the chair. EYES: Pupils equal, round, reactive to light and accommodation. No scleral icterus. Extraocular muscles intact.  HEENT: Head atraumatic, normocephalic. Oropharynx and nasopharynx clear.  NECK:  Supple, no jugular venous distention. No thyroid enlargement, no tenderness.  LUNGS: Normal breath sounds bilaterally, no wheezing, rales,rhonchi or crepitation. No use of accessory muscles of respiration.  CARDIOVASCULAR: S1, S2 normal. No murmurs, rubs, or gallops.  ABDOMEN: Soft, nontender, nondistended. Bowel sounds present. No organomegaly or mass.   EXTREMITIES: No pedal edema, cyanosis, or clubbing.  NEUROLOGIC: Cranial nerves II through XII are intact. Muscle strength 4/5 in right extremities and 3-4/5 on left  extremities. Sensation intact. Gait not checked.  PSYCHIATRIC: The patient is alert and oriented x 3.  SKIN: No obvious rash, lesion, or ulcer.    LABORATORY PANEL:   CBC  Recent Labs Lab 12/24/14 0503  WBC 6.3  HGB 12.4  HCT 37.0  PLT 172   ------------------------------------------------------------------------------------------------------------------  Chemistries   Recent Labs Lab 12/22/14 0613 12/23/14 0416 12/24/14 0503  NA 140 141  --   K 4.4 4.2  --   CL 105 107  --   CO2 26 26  --   GLUCOSE 178* 151*  --   BUN 27* 21*  --   CREATININE 1.05* 0.81 0.85  CALCIUM 10.0 9.4  --   MG 2.4  --   --   AST 20  --   --   ALT 16  --   --   ALKPHOS 63  --   --   BILITOT 0.4  --   --    ------------------------------------------------------------------------------------------------------------------  Cardiac Enzymes  Recent Labs Lab 12/22/14 0613  TROPONINI <0.03   ------------------------------------------------------------------------------------------------------------------  RADIOLOGY:  Ct Angio Head W/cm &/or Wo Cm  12/23/2014   CLINICAL DATA:  Initial evaluation for acute left-sided numbness.  EXAM: CT ANGIOGRAPHY HEAD AND NECK  TECHNIQUE: Multidetector CT imaging of the head and neck was performed using the standard protocol during bolus administration of intravenous contrast. Multiplanar CT image reconstructions and MIPs were obtained to evaluate the vascular anatomy. Carotid stenosis measurements (when applicable) are obtained utilizing NASCET criteria, using the distal internal carotid diameter as  the denominator.  CONTRAST:  75mL OMNIPAQUE IOHEXOL 350 MG/ML SOLN  COMPARISON:  Prior noncontrast head CT from earlier the same day.  FINDINGS: CTA NECK  Aortic arch: Visualize aortic arch is of  normal caliber with prominent atheromatous plaque. Bovine arch present with common origin of the right brachiocephalic and left common carotid artery. Heavy atheromatous plaque present about the proximal great vessels bilaterally. 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 Only a faint string of contrast extends through this region (series 5, image 41). There is improved opacification of the left subclavian artery at the level of the left vertebral artery, suggesting at there may be a component of retrograde flow within the left vertebral artery reflecting subclavian steal. Heterogeneous atheromatous plaque present within knee right subclavian artery as well with multi focal moderate stenoses.  Right carotid system: Atheromatous plaque at the origin of the right common carotid artery with associated stenosis of approximately 30-40% by NASCET criteria. Heterogeneous plaque present distally throughout the mid right ICA. Prominent atheromatous plaque present about the right carotid bifurcation/proximal right ICA. Associated stenosis of approximately 60-70% by NASCET criteria within the proximal right ICA. Distally, the right ICA is well opacified to the skullbase.  Left carotid system: Atheromatous plaque at the origin of the left common carotid artery with associated stenosis of approximately 50% by NASCET criteria. Scattered plaque present distally within the left common carotid artery. Atheromatous plaque about the left carotid bifurcation/proximal left ICA. There is associated stenosis of approximately 50% by NASCET criteria within the proximal left ICA. Distally, the left ICA is well opacified to the skullbase.  Vertebral arteries:Both vertebral arteries arise from the subclavian arteries. Scattered plaque present at the origin of the right vertebral artery without definite high-grade stenosis. Vertebral arteries are otherwise well  opacified to the skullbase without evidence for stenosis, dissection, or occlusion.  Skeleton: No acute osseous abnormality. No worrisome lytic or blastic osseous lesions. Mild degenerative spondylolysis noted within the visualized spine.  Other neck: Visualized lungs demonstrate no acute process. No acute soft tissue abnormality within the neck. Thyroid gland within normal limits. No adenopathy.  CTA HEAD  Anterior circulation: Petrous segments of the internal carotid arteries are patent bilaterally. There is extensive smooth atheromatous plaque within the cavernous/ supraclinoid ICAs. There is secondary moderate to severe diffuse narrowing, slightly worse on the right. Left A1 segment patent. Right A1 segment appears to be hypoplastic or absent. Anterior communicating artery and anterior cerebral arteries grossly opacified.  M1 segments patent bilaterally without high-grade stenosis or occlusion. Moderate narrowing at the left MCA bifurcation. Distal MCA branches opacified bilaterally.  Posterior circulation: Scattered atheromatous plaque present within the V4 segments bilaterally with associated moderate to severe focal stenoses. Vertebral arteries are opacified to the vertebrobasilar junction. Posterior inferior cerebral arteries appear patent. Basilar artery patent. Superior cerebellar arteries patent proximally. Both posterior cerebral arteries arise from the basilar artery and are opacified to their distal aspects.  Venous sinuses: No venous sinus thrombosis.  Anatomic variants: No anatomic variant.  No aneurysm.  Delayed phase: No abnormal enhancement on delayed sequence.C  IMPRESSION: CTA NECK IMPRESSION:  1. Atheromatous plaque at the right carotid bifurcation/proximal right ICA with associated stenosis of approximately 60-70% by NASCET criteria. 2. Atheromatous plaque at the left carotid bifurcation/proximal left ICA with associated stenosis of approximately 50% by NASCET criteria. 3. 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 There  is reconstitution distally at the origin of the left vertebral artery, which may be due to retrograde flow within left vertebral artery. 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 4. Widely patent vertebral arteries.  CTA HEAD IMPRESSION:  1. No large or proximal arterial branch occlusion within the intracranial circulation. 2. Heavy atheromatous plaque within the cavernous/supraclinoid ICAs bilaterally with associated diffuse moderate to severe narrowing. 3. Focal atheromatous plaque within the distal left V4 segments bilaterally with associated moderate to severe stenoses. 4. Moderate nonocclusive focal stenosis at the distal left M1 segment/the left MCA bifurcation.   Electronically Signed   By: Rise Mu M.D.   On: 12/23/2014 00:32   Ct Angio Neck W/cm &/or Wo/cm  12/23/2014   CLINICAL DATA:  Initial evaluation for acute left-sided numbness.  EXAM: CT ANGIOGRAPHY HEAD AND NECK  TECHNIQUE: Multidetector CT imaging of the head and neck was performed using the standard protocol during bolus administration of intravenous contrast. Multiplanar CT image reconstructions and MIPs were obtained to evaluate the vascular anatomy. Carotid stenosis measurements (when applicable) are obtained utilizing NASCET criteria, using the distal internal carotid diameter as the denominator.  CONTRAST:  75mL OMNIPAQUE IOHEXOL 350 MG/ML SOLN  COMPARISON:  Prior noncontrast head CT from earlier the same day.  FINDINGS: CTA NECK  Aortic arch: Visualize aortic arch is of normal caliber with prominent atheromatous plaque. Bovine arch present with common origin of the right brachiocephalic and left common carotid artery. Heavy atheromatous plaque present about the proximal great vessels bilaterally. 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 Only a faint string of contrast extends through this region (series 5, image 41). There is improved opacification of the left subclavian artery at the level of the left vertebral artery, suggesting at there may be a component of retrograde flow within the left vertebral artery reflecting subclavian steal. Heterogeneous atheromatous plaque present within knee right subclavian artery as well with multi focal moderate stenoses.  Right carotid system: Atheromatous plaque at the origin of the right common carotid artery with associated stenosis of approximately 30-40% by NASCET criteria. Heterogeneous plaque present distally throughout the mid right ICA. Prominent atheromatous plaque present about the right carotid bifurcation/proximal right ICA. Associated stenosis of approximately 60-70% by NASCET criteria within the proximal right ICA. Distally, the right ICA is well opacified to the skullbase.  Left carotid system: Atheromatous plaque at the origin of the left common carotid artery with associated stenosis of approximately 50% by NASCET criteria. Scattered plaque present distally within the left common carotid artery. Atheromatous plaque about the left carotid bifurcation/proximal left ICA. There is associated stenosis of approximately 50% by NASCET criteria within the proximal left ICA. Distally, the left ICA is well opacified to the skullbase.  Vertebral arteries:Both vertebral arteries arise from the subclavian arteries. Scattered plaque present at the origin of the right vertebral artery without definite high-grade stenosis. Vertebral arteries are otherwise well opacified to the skullbase without evidence for stenosis, dissection, or occlusion.  Skeleton: No acute osseous abnormality. No worrisome lytic or blastic osseous lesions. Mild degenerative spondylolysis noted within the visualized spine.  Other neck: Visualized lungs demonstrate no acute process. No acute soft tissue  abnormality within the neck. Thyroid gland within normal limits. No adenopathy.  CTA HEAD  Anterior circulation: Petrous segments of the internal carotid arteries are patent bilaterally. There is extensive smooth atheromatous plaque within the cavernous/ supraclinoid ICAs. There is secondary moderate to severe  diffuse narrowing, slightly worse on the right. Left A1 segment patent. Right A1 segment appears to be hypoplastic or absent. Anterior communicating artery and anterior cerebral arteries grossly opacified.  M1 segments patent bilaterally without high-grade stenosis or occlusion. Moderate narrowing at the left MCA bifurcation. Distal MCA branches opacified bilaterally.  Posterior circulation: Scattered atheromatous plaque present within the V4 segments bilaterally with associated moderate to severe focal stenoses. Vertebral arteries are opacified to the vertebrobasilar junction. Posterior inferior cerebral arteries appear patent. Basilar artery patent. Superior cerebellar arteries patent proximally. Both posterior cerebral arteries arise from the basilar artery and are opacified to their distal aspects.  Venous sinuses: No venous sinus thrombosis.  Anatomic variants: No anatomic variant.  No aneurysm.  Delayed phase: No abnormal enhancement on delayed sequence.C  IMPRESSION: CTA NECK IMPRESSION:  1. Atheromatous plaque at the right carotid bifurcation/proximal right ICA with associated stenosis of approximately 60-70% by NASCET criteria. 2. Atheromatous plaque at the left carotid bifurcation/proximal left ICA with associated stenosis of approximately 50% by NASCET criteria. 3. 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 There is reconstitution distally at the origin of the left vertebral artery, which may be due to retrograde flow within left vertebral artery. 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 4.  Widely patent vertebral arteries.  CTA HEAD IMPRESSION:  1. No large or proximal arterial branch occlusion within the intracranial circulation. 2. Heavy atheromatous plaque within the cavernous/supraclinoid ICAs bilaterally with associated diffuse moderate to severe narrowing. 3. Focal atheromatous plaque within the distal left V4 segments bilaterally with associated moderate to severe stenoses. 4. Moderate nonocclusive focal stenosis at the distal left M1 segment/the left MCA bifurcation.   Electronically Signed   By: Rise Mu M.D.   On: 12/23/2014 00:32    EKG:   Orders placed or performed during the hospital encounter of 12/22/14  . EKG 12-Lead  . EKG 12-Lead  . ED EKG  . ED EKG    ASSESSMENT AND PLAN:   Acute CVA (R hemispheric infarct). Continue ASA, plavix,  discontinued zocor and started lipitor.   ejection fraction 40% with a mild decrease the systolic function per echo. Continue PT, OT. Physical therapy evaluation suggested skilled nursing facility placement.  Moderate to high grade right Carotid artery stenosis. per Dr. Wyn Quaker, the patient needscarotid intervention in 1-2 weeks. The patient would like to have her surgery performed the back in [place].  Hyperlipidemia.  lipid panel shows LDL 170. Continue lipitor. HTN. HTN medication discontinued due to low blood pressure. DM2. Continue Sliding scale.  Chronic diastolic CHF. Stable.  * Epistaxis. Watch for any active bleeding.   All the records are reviewed and case discussed with Care Management/Social Workerr. Management plans discussed with the patient, her daughter and they are in agreement.  CODE STATUS: DO NOT RESUSCITATE  TOTAL TIME TAKING CARE OF THIS PATIENT: 33 minutes.   POSSIBLE D/C IN 3 DAYS, DEPENDING ON CLINICAL CONDITION.   Shaune Pollack M.D on 12/24/2014 at 2:28 PM  Between 7am to 6pm - Pager - [phone]  After 6pm go to [email or web address] - password [email or web address] [facility]  [place] [facility]   Office  [phone]  CC: Primary care physician; Michail Sermon, MD

## 2014-12-24 NOTE — Progress Notes (Signed)
Physical Therapy Treatment Patient Details Name: Carla Dunn MRN: [id number] DOB: 01/16/1941 Today's Date: 12/24/2014    History of Present Illness Patient is a 74 y.o. female admitted on 9 October after experiencing L facial numbness and N/T in L UE and LE. Patient has hx of HTN, diabetes, CAD, diastolic CHF, PVD, and sleep apnea.    PT Comments    Pt in bed upon arrival; lethargic, but agreeable to PT. Daughter present in room. Pt complains of left sided weakness. Pt demonstrating improved bed mobility with less assist needed; requires increased time. Transfers require cueing for safe hand placement and ambulation improving as distance; however, pt becomes dizzy at 35 ft requiring need to turn back to room. Dizziness subsides with seated rest. Pt requires cues for safe approach to bed, as pt abandons rolling walker and reaches for bed. Corrected with cueing. Seated exercises tolerated for 10 to 15 repetitions. Pt/daughter educated on importance of not over stressing the brain and listening to cues to discontinue exercises such as fatigue and difficulty concentrating/performing exercise. Also educated pt/daugher on need for rest/sleep and benefits of music during the acute phase post a CVA. Continue PT progressing all strengthening and functional mobility as appropriate.   Follow Up Recommendations  SNF     Equipment Recommendations  Rolling walker with 5" wheels    Recommendations for Other Services       Precautions / Restrictions Precautions Precautions: Fall Restrictions Weight Bearing Restrictions: No    Mobility  Bed Mobility Overal bed mobility: Needs Assistance Bed Mobility: Supine to Sit;Sit to Supine     Supine to sit: Min guard;HOB elevated Sit to supine: Min assist (for BLEs)   General bed mobility comments: Requires Max A x 2 to reposition upward in bed   Transfers Overall transfer level: Needs assistance Equipment used: Rolling walker (2 wheeled) Transfers:  Sit to/from Stand Sit to Stand: Min assist         General transfer comment: Requires cues for safe hand placement. Takes several small rocking attempts to stand ( )  Ambulation/Gait Ambulation/Gait assistance: Min guard Ambulation Distance (Feet): 70 Feet Assistive device: Rolling walker (2 wheeled) Gait Pattern/deviations: Step-through pattern;Decreased stride length;Narrow base of support;Decreased step length - right;Decreased step length - left Gait velocity: reduced Gait velocity interpretation: <1.8 ft/sec, indicative of risk for recurrent falls General Gait Details: Pt notes weakness on L with ambulation; short steps; becomes dizzy after 35 ft; turned around to ambulate back to room   Stairs            Wheelchair Mobility    Modified Rankin (Stroke Patients Only)       Balance           Standing balance support: Bilateral upper extremity supported Standing balance-Leahy Scale: Fair                      Cognition Arousal/Alertness: Lethargic Behavior During Therapy: WFL for tasks assessed/performed Overall Cognitive Status: Within Functional Limits for tasks assessed                      Exercises General Exercises - Lower Extremity Long Arc Quad: AROM;Both;10 reps;Seated (decreased range on L) Hip ABduction/ADduction: AROM;Both;10 reps;Seated Hip Flexion/Marching: AROM;AAROM;Both;Seated;15 reps (requires assist on L initially) Toe Raises: AROM;Both;20 reps;Seated Heel Raises: AROM;Both;15 reps;Seated    General Comments        Pertinent Vitals/Pain Pain Assessment: No/denies pain    Home Living  Prior Function            PT Goals (current goals can now be found in the care plan section) Progress towards PT goals: Progressing toward goals    Frequency  7X/week    PT Plan Current plan remains appropriate    Co-evaluation             End of Session Equipment Utilized During  Treatment: Gait belt Activity Tolerance: Patient limited by lethargy Patient left: in bed;with call bell/phone within reach;with bed alarm set;with family/visitor present     Time: 1330-1355 PT Time Calculation (min) (ACUTE ONLY): 25 min  Charges:  $Gait Training: 8-22 mins $Therapeutic Exercise: 8-22 mins                    G Codes:      Kristeen Miss 12/24/2014, 2:08 PM

## 2014-12-24 NOTE — Clinical Social Work Note (Signed)
Patient has received bed offers from both [facility] and [facility]. Patient has only had one night inpatient stay thus far. CSW has followed up with patient and will follow up with patient and daughter when daughter arrives today. York Spaniel MSW,LCSW [phone]

## 2014-12-24 NOTE — Plan of Care (Signed)
Problem: Discharge/Transitional Outcomes Goal: Other Discharge Outcomes/Goals Outcome: Progressing Plan of care progress to goal: VSS Pt up to [facility] with one person assist Neuro checks WDL, with slight left-sided weakness

## 2014-12-25 LAB — GLUCOSE, CAPILLARY
GLUCOSE-CAPILLARY: 124 mg/dL — AB (ref 65–99)
GLUCOSE-CAPILLARY: 149 mg/dL — AB (ref 65–99)
Glucose-Capillary: 157 mg/dL — ABNORMAL HIGH (ref 65–99)
Glucose-Capillary: 135 mg/dL — ABNORMAL HIGH (ref 65–99)

## 2014-12-25 NOTE — Plan of Care (Signed)
Problem: Discharge/Transitional Outcomes Goal: Other Discharge Outcomes/Goals Outcome: Progressing Plan of care progress to goal: Pt continues to have left sided weakness. NIH - 3 VSS. No c/o pain. Pt resting throughout most of the day. Plan for d/c tomorrow to SNF.

## 2014-12-25 NOTE — Clinical Social Work Note (Signed)
[facility] [place] for [place] for tomorrow. Patient will transport by personal vehicle. York SpanielMonica Kynesha Guerin MSW,LCSW [phone]

## 2014-12-25 NOTE — Care Management Important Message (Signed)
Important Message  Patient Details  Name: Carla Dunn Graefe MRN: [id number] Date of Birth: 04/30/1940   Medicare Important Message Given:  Yes-third notification given    Gwenette GreetBrenda S Bernis Schreur, RN 12/25/2014, 9:03 AM

## 2014-12-25 NOTE — Progress Notes (Signed)
[facility] - [place] at [facility]   PATIENT NAME: Carla Dunn    MR#:  [id number]  DATE OF BIRTH:  11-11-1940  SUBJECTIVE:  CHIEF COMPLAINT:   Chief Complaint  Patient presents with  . Altered Mental Status   better weakness on the left side, but had small nose bleeding clot last night.  REVIEW OF SYSTEMS:  CONSTITUTIONAL: No fever, has weakness.  EYES: No blurred or double vision.  EARS, NOSE, AND THROAT: No tinnitus or ear pain. But has epistaxis this morning. RESPIRATORY: No cough, shortness of breath, wheezing or hemoptysis.  CARDIOVASCULAR: No chest pain, orthopnea, edema.  GASTROINTESTINAL: No nausea, vomiting, diarrhea or abdominal pain.  GENITOURINARY: No dysuria, hematuria.  ENDOCRINE: No polyuria, nocturia,  HEMATOLOGY: No anemia, easy bruising or bleeding SKIN: No rash or lesion. MUSCULOSKELETAL: No joint pain or arthritis.   NEUROLOGIC: No tingling, numbness but has weakness on left side. No slurred speech or dysphagia. PSYCHIATRY: No anxiety or depression.   DRUG ALLERGIES:  No Known Allergies  VITALS:  Blood pressure 115/52, pulse 88, temperature 98.1 F (36.7 C), temperature source Oral, resp. rate 20, height  (1.702 m), weight 83.643 kg (184 lb 6.4 oz), SpO2 97 %.  PHYSICAL EXAMINATION:  GENERAL:  74 y.o.-year-old patient lying in the bed with no acute distress. Sitting in the chair. EYES: Pupils equal, round, reactive to light and accommodation. No scleral icterus. Extraocular muscles intact.  HEENT: Head atraumatic, normocephalic. Oropharynx and nasopharynx clear.  NECK:  Supple, no jugular venous distention. No thyroid enlargement, no tenderness.  LUNGS: Normal breath sounds bilaterally, no wheezing, rales,rhonchi or crepitation. No use of accessory muscles of respiration.  CARDIOVASCULAR: S1, S2 normal. No murmurs, rubs, or gallops.  ABDOMEN: Soft, nontender, nondistended. Bowel sounds present. No organomegaly or mass.   EXTREMITIES: No pedal edema, cyanosis, or clubbing.  NEUROLOGIC: Cranial nerves II through XII are intact. Muscle strength 4/5 in right extremities and 3/5 on left  extremities. Sensation intact. Gait not checked.  PSYCHIATRIC: The patient is alert and oriented x 3.  SKIN: No obvious rash, lesion, or ulcer.    LABORATORY PANEL:   CBC  Recent Labs Lab 12/24/14 0503  WBC 6.3  HGB 12.4  HCT 37.0  PLT 172   ------------------------------------------------------------------------------------------------------------------  Chemistries   Recent Labs Lab 12/22/14 0613 12/23/14 0416 12/24/14 0503  NA 140 141  --   K 4.4 4.2  --   CL 105 107  --   CO2 26 26  --   GLUCOSE 178* 151*  --   BUN 27* 21*  --   CREATININE 1.05* 0.81 0.85  CALCIUM 10.0 9.4  --   MG 2.4  --   --   AST 20  --   --   ALT 16  --   --   ALKPHOS 63  --   --   BILITOT 0.4  --   --    ------------------------------------------------------------------------------------------------------------------  Cardiac Enzymes  Recent Labs Lab 12/22/14 0613  TROPONINI <0.03   ------------------------------------------------------------------------------------------------------------------  RADIOLOGY:  No results found.  EKG:   Orders placed or performed during the hospital encounter of 12/22/14  . EKG 12-Lead  . EKG 12-Lead  . ED EKG  . ED EKG    ASSESSMENT AND PLAN:   Acute CVA (R hemispheric infarct). Continue ASA, plavix,  discontinued zocor and started lipitor.   ejection fraction 40% with a mild decrease the systolic function per echo. Continue PT, OT.  Moderate to high  grade right Carotid artery stenosis. per Dr. Wyn Quakerew, the patient needscarotid intervention in 1-2 weeks. The patient would like to have her surgery performed the back in [place].  Hyperlipidemia.  lipid panel shows LDL 170. Continue lipitor. HTN. HTN medication discontinued due to low blood pressure. DM2. Continue Sliding  scale.  Chronic diastolic CHF. Stable.  * Epistaxis. Watch for any active bleeding.   All the records are reviewed and case discussed with Care Management/Social Workerr. Management plans discussed with the patient, her daughter and they are in agreement.  CODE STATUS: DO NOT RESUSCITATE  TOTAL TIME TAKING CARE OF THIS PATIENT: 32 minutes.   POSSIBLE D/C IN tomorrow, DEPENDING ON CLINICAL CONDITION.   Shaune Pollackhen, Shayanne Gomm M.D on 12/25/2014 at 2:45 PM  Between 7am to 6pm - Pager - [phone]  After 6pm go to [email or web address] - password [email or web address] [facility]  [facility]  Office  [phone]  CC: Primary care physician; Michail SermonBell, Andre E, MD

## 2014-12-25 NOTE — Progress Notes (Signed)
Physical Therapy Treatment Patient Details Name: Carla Dunn MRN: [id number] DOB: 09/11/1940 Today's Date: 12/25/2014    History of Present Illness Patient is a 74 y.o. female admitted on 9 October after experiencing L facial numbness and N/T in L UE and LE. Patient has hx of HTN, diabetes, CAD, diastolic CHF, PVD, and sleep apnea.    PT Comments    Pt complains of continued fatigue this morn. Pt continues to require Min A and cueing for safe hand placement with transfers. Pt did not feel up to ambulation this morn, but did agree to up in chair and ambulated short distance bed to chair post exercises. Left sided weakness evident with exercises with decreased range of motion and occasional assist required. Continue PT for progression of strength, endurance and all safe functional mobility as appropriate.   Follow Up Recommendations  SNF     Equipment Recommendations  Rolling walker with 5" wheels    Recommendations for Other Services       Precautions / Restrictions Precautions Precautions: Fall Restrictions Weight Bearing Restrictions: No    Mobility  Bed Mobility Overal bed mobility: Needs Assistance Bed Mobility: Supine to Sit     Supine to sit: Min guard;HOB elevated     General bed mobility comments:  (Takes increased time)  Transfers Overall transfer level: Needs assistance Equipment used: Rolling walker (2 wheeled) Transfers: Sit to/from Stand Sit to Stand: Min assist         General transfer comment:  (cues for hands)  Ambulation/Gait Ambulation/Gait assistance: Min guard Ambulation Distance (Feet): 5 Feet (to chair; pt did not wish to ambulate further at this time) Assistive device: Rolling walker (2 wheeled)   Gait velocity: reduced       Stairs            Wheelchair Mobility    Modified Rankin (Stroke Patients Only)       Balance           Standing balance support: Bilateral upper extremity supported Standing balance-Leahy  Scale: Fair                      Cognition Arousal/Alertness: Awake/alert (fatigued) Behavior During Therapy: WFL for tasks assessed/performed Overall Cognitive Status: Within Functional Limits for tasks assessed                      Exercises General Exercises - Lower Extremity Ankle Circles/Pumps: AROM;Both;20 reps;Supine Quad Sets: Strengthening;Both;20 reps;Supine Gluteal Sets: Strengthening;Both;20 reps;Supine Short Arc Quad: AROM;Both;20 reps;Supine Heel Slides: AROM;Both;Supine;15 reps Hip ABduction/ADduction: AROM;Both;15 reps;Supine Straight Leg Raises: AAROM;Left;10 reps;Supine    General Comments        Pertinent Vitals/Pain Pain Assessment: No/denies pain    Home Living                      Prior Function            PT Goals (current goals can now be found in the care plan section) Progress towards PT goals: Progressing toward goals    Frequency  7X/week    PT Plan Current plan remains appropriate    Co-evaluation             End of Session Equipment Utilized During Treatment: Gait belt Activity Tolerance: Patient tolerated treatment well;Patient limited by fatigue Patient left: in chair;with call bell/phone within reach     Time: 0934-1003 PT Time Calculation (min) (ACUTE ONLY): 29 min  Charges:  $Gait  Training: 8-22 mins $Therapeutic Exercise: 8-22 mins                    G Codes:      Kristeen Miss 12/25/2014, 10:23 AM

## 2014-12-25 NOTE — Plan of Care (Signed)
Problem: Discharge/Transitional Outcomes Goal: Other Discharge Outcomes/Goals Outcome: Progressing Plan of care progress to goal: VSS Continues with mild left sided weakness. NIH = 3 this shift

## 2014-12-26 LAB — GLUCOSE, CAPILLARY: Glucose-Capillary: 120 mg/dL — ABNORMAL HIGH (ref 65–99)

## 2014-12-26 MED ORDER — ATORVASTATIN CALCIUM 40 MG PO TABS: 40.0000 mg | ORAL_TABLET | Freq: Every day | ORAL | Status: AC

## 2014-12-26 MED ORDER — ZOLPIDEM TARTRATE 5 MG PO TABS: 5.0000 mg | ORAL_TABLET | Freq: Every evening | ORAL | Status: AC | PRN

## 2014-12-26 MED ORDER — TRAMADOL HCL 50 MG PO TABS: 50.0000 mg | ORAL_TABLET | Freq: Three times a day (TID) | ORAL | Status: AC | PRN

## 2014-12-26 MED ORDER — CLONAZEPAM 1 MG PO TABS: 1.0000 mg | ORAL_TABLET | Freq: Three times a day (TID) | ORAL | Status: AC

## 2014-12-26 NOTE — Plan of Care (Signed)
Problem: Discharge/Transitional Outcomes Goal: Other Discharge Outcomes/Goals Outcome: Progressing Plan of care progress to goals:  1. No c/o pain, resting comfortably in bed 2. Hemodynamically:             -VSS, afebrile              -NIH 3, left sided weakness remains 3. Tolerating carb modified diet well 4. Moderate fall risk. +1 assist to bathroom w/ walker.

## 2014-12-26 NOTE — Clinical Social Work Note (Signed)
Patient discharged as expected. [facility]. Nurse called report and patient's daughter transported. York SpanielMonica Kaislee Chao MSW,LCSW [phone]

## 2014-12-26 NOTE — Clinical Social Work Placement (Signed)
   CLINICAL SOCIAL WORK PLACEMENT  NOTE  Date:  12/26/2014  Patient Details  Name: Carla Dunn MRN: [id number] Date of Birth: 12/09/1940  Clinical Social Work is seeking post-discharge placement for this patient at the Skilled  Nursing Facility level of care (*CSW will initial, date and re-position this form in  chart as items are completed):  Yes   Patient/family provided with [facility] Clinical Social Work Department's list of facilities offering this level of care within the geographic area requested by the patient (or if unable, by the patient's family).  Yes   Patient/family informed of their freedom to choose among providers that offer the needed level of care, that participate in Medicare, Medicaid or managed care program needed by the patient, have an available bed and are willing to accept the patient.  Yes   Patient/family informed of [facility]'s ownership interest in [facility] and [facility], as well as of the fact that they are under no obligation to receive care at these facilities.  PASRR submitted to EDS on 12/23/14     PASRR number received on 12/23/14     Existing PASRR number confirmed on       FL2 transmitted to all facilities in geographic area requested by pt/family on 12/23/14     FL2 transmitted to all facilities within larger geographic area on       Patient informed that his/her managed care company has contracts with or will negotiate with certain facilities, including the following:        Yes   Patient/family informed of bed offers received.  Patient [facility] bed at  [facility] [place]     Physician recommends and patient chooses bed at  [facility](SNF)    Patient to be transferred to  [facility]) on 12/26/14.  Patient to be transferred to facility by  (daughter)     Patient family notified on 12/26/14 of transfer.  Name of family member notified:   (patient and daughter)     PHYSICIAN       Additional Comment:     _______________________________________________ York SpanielMonica Lyndia Bury, LCSW 12/26/2014, 12:18 PM

## 2014-12-26 NOTE — Discharge Summary (Signed)
[facility] Physicians - [place] at [facility]   PATIENT NAME: Carla Dunn    MR#:  [id number]  DATE OF BIRTH:  [birth year]  DATE OF ADMISSION:  12/22/2014 ADMITTING PHYSICIAN: Shaune Pollack, MD  DATE OF DISCHARGE:  12/26/2014 PRIMARY CARE PHYSICIAN: Michail Sermon, MD    ADMISSION DIAGNOSIS:  Confusion [R41.0] Stroke (cerebrum) (HCC) [I63.9] Generalized weakness [R53.1] Left sided numbness [R20.0]   DISCHARGE DIAGNOSIS:   Acute CVA SECONDARY DIAGNOSIS:   Past Medical History  Diagnosis Date  . Diabetes mellitus without complication (HCC)   . Tremors of nervous system   . Pacemaker   . Peripheral vascular disease (HCC)   . CHF (congestive heart failure) (HCC)   . Sleep apnea   . Myocardial infarction (HCC)   . Coronary artery disease   . Depression   . Second degree heart block   . Hypertension     HOSPITAL COURSE:   Acute CVA (R hemispheric infarct). She has been treated with ASA, plavix, discontinued zocor and started lipitor. ejection fraction 40% with mild decrease the systolic function per echo. Continue PT, OT.  Moderate to high grade right Carotid artery stenosis. per Dr. Wyn Quaker, the patient needs carotid intervention in 1-2 weeks. The patient would like to have her surgery performed the back in [place].  Hyperlipidemia. lipid panel shows LDL 170. Continue lipitor. HTN. HTN medication discontinued due to low blood pressure. BP is normal. Resume after discharge. Follow up PCP for adjustment. DM2. on Sliding scale. resume po meds after discharge. Chronic diastolic CHF. Stable.  * Epistaxis. No nose bleeding today. Watch for any active bleeding.   DISCHARGE CONDITIONS:   Stable, discharge to SNF today.  CONSULTS OBTAINED:  Treatment Team:  Annice Needy, MD  DRUG ALLERGIES:  No Known Allergies  DISCHARGE MEDICATIONS:   Current Discharge Medication List    START taking these medications   Details  atorvastatin (LIPITOR) 40 MG tablet  Take 1 tablet (40 mg total) by mouth daily at 6 PM. Qty: 30 tablet, Refills: 0      CONTINUE these medications which have CHANGED   Details  clonazePAM (KLONOPIN) 1 MG tablet Take 1 tablet (1 mg total) by mouth 3 (three) times daily. Qty: 30 tablet, Refills: 0    traMADol (ULTRAM) 50 MG tablet Take 1 tablet (50 mg total) by mouth every 8 (eight) hours as needed for moderate pain. Qty: 21 tablet, Refills: 0    zolpidem (AMBIEN) 5 MG tablet Take 1 tablet (5 mg total) by mouth at bedtime as needed for sleep. Qty: 14 tablet, Refills: 0      CONTINUE these medications which have NOT CHANGED   Details  Ascorbic Acid (VITAMIN C) 1000 MG tablet Take 1,000 mg by mouth daily.    aspirin 81 MG tablet Take 81 mg by mouth daily.    atenolol (TENORMIN) 25 MG tablet Take 25 mg by mouth daily.    buPROPion (WELLBUTRIN SR) 150 MG 12 hr tablet Take 150 mg by mouth daily.    calcium-vitamin D (OSCAL WITH D) 500-200 MG-UNIT tablet Take 1 tablet by mouth.    clopidogrel (PLAVIX) 75 MG tablet Take 75 mg by mouth daily.    co-enzyme Q-10 30 MG capsule Take 100 mg by mouth 3 (three) times daily.    desvenlafaxine (PRISTIQ) 50 MG 24 hr tablet Take 50 mg by mouth daily.    docusate sodium (COLACE) 100 MG capsule Take 200 mg by mouth at bedtime.  ezetimibe (ZETIA) 10 MG tablet Take 10 mg by mouth daily.    famciclovir (FAMVIR) 250 MG tablet Take 250 mg by mouth 2 (two) times daily.    furosemide (LASIX) 20 MG tablet Take 20 mg by mouth.    lisinopril (PRINIVIL,ZESTRIL) 10 MG tablet Take 10 mg by mouth at bedtime.    magnesium oxide (MAG-OX) 400 MG tablet Take 500 mg by mouth daily.    ranolazine (RANEXA) 500 MG 12 hr tablet Take 500 mg by mouth 2 (two) times daily.    sitaGLIPtin (JANUVIA) 100 MG tablet Take 100 mg by mouth daily.    tolterodine (DETROL LA) 2 MG 24 hr capsule Take 2 mg by mouth daily.    nitroGLYCERIN (NITROSTAT) 0.4 MG SL tablet Place 0.4 mg under the tongue every 5  (five) minutes as needed for chest pain.      STOP taking these medications     simvastatin (ZOCOR) 10 MG tablet          DISCHARGE INSTRUCTIONS:    If you experience worsening of your admission symptoms, develop shortness of breath, life threatening emergency, suicidal or homicidal thoughts you must seek medical attention immediately by calling 911 or calling your MD immediately  if symptoms less severe.  You Must read complete instructions/literature along with all the possible adverse reactions/side effects for all the Medicines you take and that have been prescribed to you. Take any new Medicines after you have completely understood and accept all the possible adverse reactions/side effects.   Please note  You were cared for by a hospitalist during your hospital stay. If you have any questions about your discharge medications or the care you received while you were in the hospital after you are discharged, you can call the unit and asked to speak with the hospitalist on call if the hospitalist that took care of you is not available. Once you are discharged, your primary care physician will handle any further medical issues. Please note that NO REFILLS for any discharge medications will be authorized once you are discharged, as it is imperative that you return to your primary care physician (or establish a relationship with a primary care physician if you do not have one) for your aftercare needs so that they can reassess your need for medications and monitor your lab values.    Today   SUBJECTIVE   No complaint except left leg weakness.   VITAL SIGNS:  Blood pressure 127/66, pulse 77, temperature 97.9 F (36.6 C), temperature source Oral, resp. rate 18, height 5\' 7"  (1.702 m), weight 83.643 kg (184 lb 6.4 oz), SpO2 95 %.  I/O:   Intake/Output Summary (Last 24 hours) at 12/26/14 0934 Last data filed at 12/25/14 1824  Gross per 24 hour  Intake    480 ml  Output      0 ml   Net    480 ml    PHYSICAL EXAMINATION:  GENERAL:  74 y.o.-year-old patient lying in the bed with no acute distress.  EYES: Pupils equal, round, reactive to light and accommodation. No scleral icterus. Extraocular muscles intact.  HEENT: Head atraumatic, normocephalic. Oropharynx and nasopharynx clear.  NECK:  Supple, no jugular venous distention. No thyroid enlargement, no tenderness.  LUNGS: Normal breath sounds bilaterally, no wheezing, rales,rhonchi or crepitation. No use of accessory muscles of respiration.  CARDIOVASCULAR: S1, S2 normal. No murmurs, rubs, or gallops.  ABDOMEN: Soft, non-tender, non-distended. Bowel sounds present. No organomegaly or mass.  EXTREMITIES: No pedal edema,  cyanosis, or clubbing.  NEUROLOGIC: Cranial nerves II through XII are intact. Muscle strength 5/5 in all extremities except 4/5 in left leg. Sensation intact. Gait not checked.  PSYCHIATRIC: The patient is alert and oriented x 3.  SKIN: No obvious rash, lesion, or ulcer.   DATA REVIEW:   CBC  Recent Labs Lab 12/24/14 0503  WBC 6.3  HGB 12.4  HCT 37.0  PLT 172    Chemistries   Recent Labs Lab 12/22/14 0613 12/23/14 0416 12/24/14 0503  NA 140 141  --   K 4.4 4.2  --   CL 105 107  --   CO2 26 26  --   GLUCOSE 178* 151*  --   BUN 27* 21*  --   CREATININE 1.05* 0.81 0.85  CALCIUM 10.0 9.4  --   MG 2.4  --   --   AST 20  --   --   ALT 16  --   --   ALKPHOS 63  --   --   BILITOT 0.4  --   --     Cardiac Enzymes  Recent Labs Lab 12/22/14 0613  TROPONINI <0.03    Microbiology Results  No results found for this or any previous visit.  RADIOLOGY:  No results found.      Management plans discussed with the patient, her daughter and they are in agreement.  CODE STATUS:     Code Status Orders        Start     Ordered   12/22/14 [phone]  Do not attempt resuscitation (DNR)   Continuous    Question Answer Comment  In the event of cardiac or respiratory ARREST Do not  call a "code blue"   In the event of cardiac or respiratory ARREST Do not perform Intubation, CPR, defibrillation or ACLS   In the event of cardiac or respiratory ARREST Use medication by any route, position, wound care, and other measures to relive pain and suffering. May use oxygen, suction and manual treatment of airway obstruction as needed for comfort.      12/22/14 0916      TOTAL TIME TAKING CARE OF THIS PATIENT: 37 minutes.    Shaune Pollack M.D on 12/26/2014 at 9:34 AM  Between 7am to 6pm - Pager - [phone]  After 6pm go to [email or web address] - password [email or web address] [facility]  [place] [facility]  Office  [phone]  CC: Primary care physician; Michail Sermon, MD

## 2014-12-26 NOTE — Discharge Instructions (Signed)
Lynnea MaizesDuele Geier    [id number]   09/15/1940  Type of procedure: CT Angio Head/Neck  12/22/2014  [facility] some of the x-ray dye leaked into the tissues around the vein that the x-ray dye was given through.  What should you do?  1. Apply ice 20 minutes four times a day for three days.  2. Keep the affected extremity elevated above the rest of your body for 48 hours.  3. If you develop any of the following signs or symptoms, please contact Radiology at [phone].  Increased pain  Increasing swelling   Change in sensation (ex. Numbness, tingling)  Development of redness or increase in warmth around the affected area  Increasing hardness  Blistering   I have read and understand these instructions.  Any questions I raised have been discussed to my satisfaction.  I understand that failure to follow these instructions may result in additional complications.    Heart healthy and ADA diet. Activity as tolerated.

## 2014-12-26 NOTE — Progress Notes (Signed)
[facility].  Daughter is transporting to facility.  IV discontinued. Pt discharged

## 2017-01-27 IMAGING — CR DG CHEST 1V PORT
1 series · 1 of 1 positions shown · non-contrast
Comparison: None.

CLINICAL DATA: Acute onset of tachycardia. Patient not feeling
right. Initial encounter.

EXAM:
PORTABLE CHEST 1 VIEW

[dg chest port 1 view]
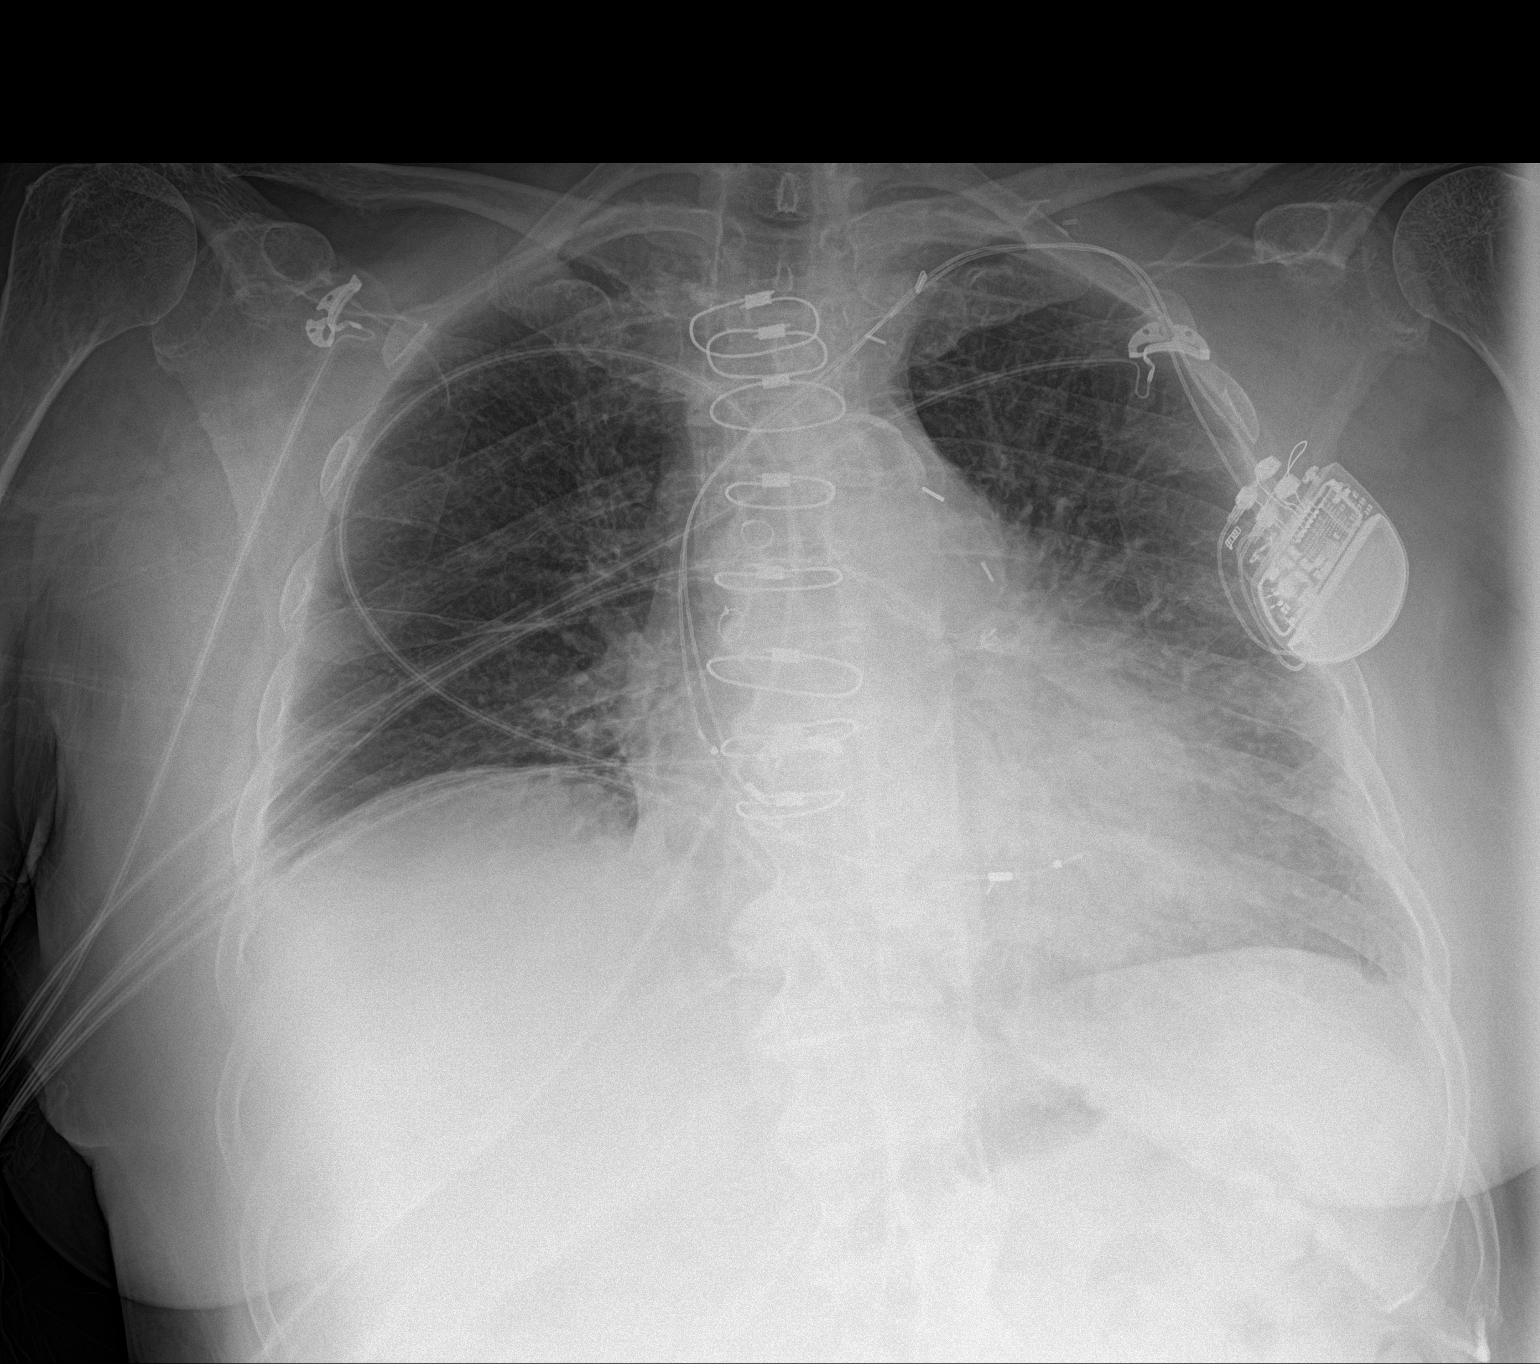

[1 of 1 positions shown; findings below may reference images not displayed]

FINDINGS: The lungs are mildly hypoexpanded. Vascular crowding and vascular
congestion are seen, with mildly increased interstitial markings,
raising question for mild interstitial edema. There is no evidence
of pleural effusion or pneumothorax.

The cardiomediastinal silhouette is borderline normal in size. The
patient is status post median sternotomy. A pacemaker is noted
overlying the left chest wall, with leads ending overlying the right
atrium and right ventricle. No acute osseous abnormalities are seen.
Scattered clips are seen overlying the left hemithorax.
IMPRESSION: Lungs mildly hypoexpanded. Vascular congestion noted, with mildly
increased interstitial markings, raising question for mild
interstitial edema.

## 2017-01-27 IMAGING — CT CT HEAD W/O CM
2 series · 15 of 30 positions shown, 19 images · non-contrast
Comparison: None.

CLINICAL DATA: Awoke at [DATE] a.m., tachycardia, arrived from hotel.
History of diabetes in tremor, pacemaker.

EXAM:
CT HEAD WITHOUT CONTRAST
TECHNIQUE: Contiguous axial images were obtained from the base of the skull
through the vertex without intravenous contrast.

[Series 2: soft tissue · axial · 0.44mm/px · z∈[-179,-159]mm · 2 of 30 slices shown]
[im 3/30  brain]
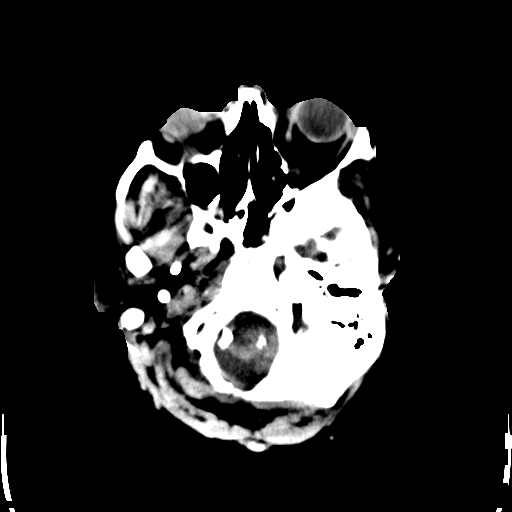
[im 7/30  brain]
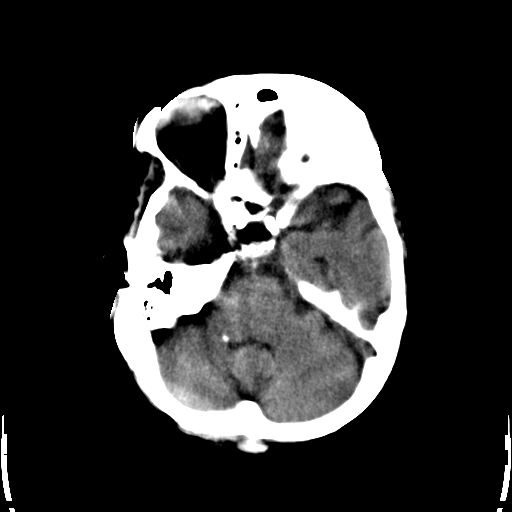

[Series 4: soft tissue recon · axial · 0.42mm/px · z∈[-157,-34]mm · 13 of 31 slices shown, 17 images]
[im 3/31  brain]
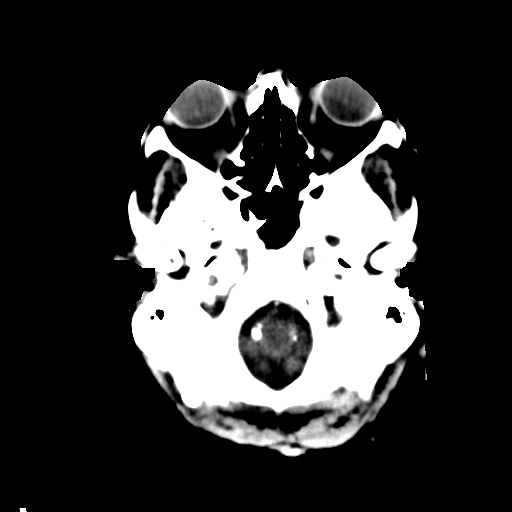
[im 3/31  bone]
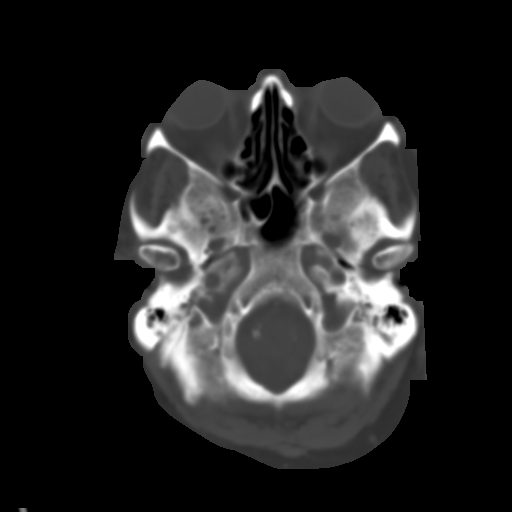
[im 5/31  brain]
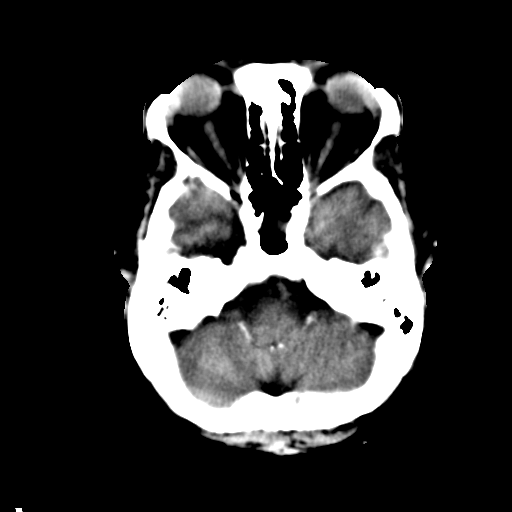
[im 7/31  brain]
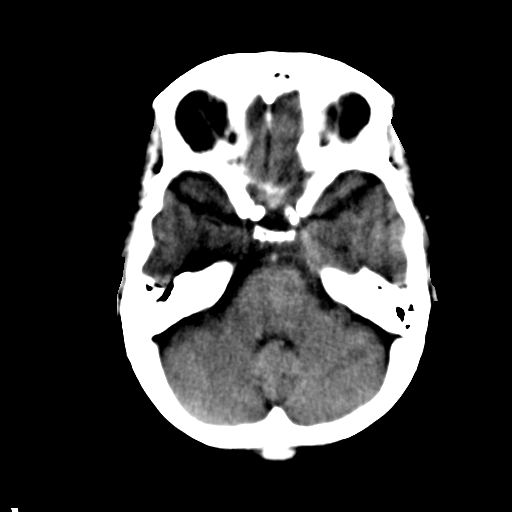
[im 9/31  brain]
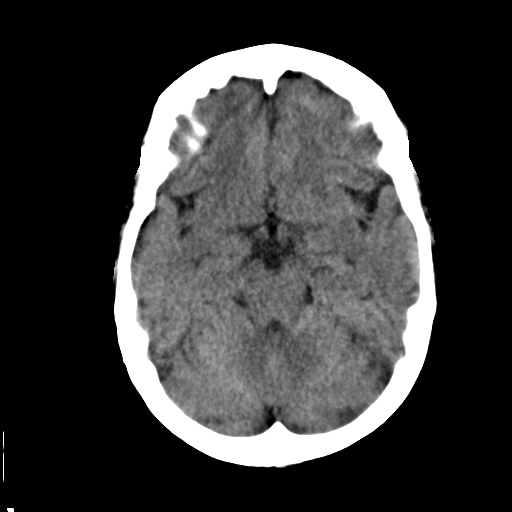
[im 11/31  brain]
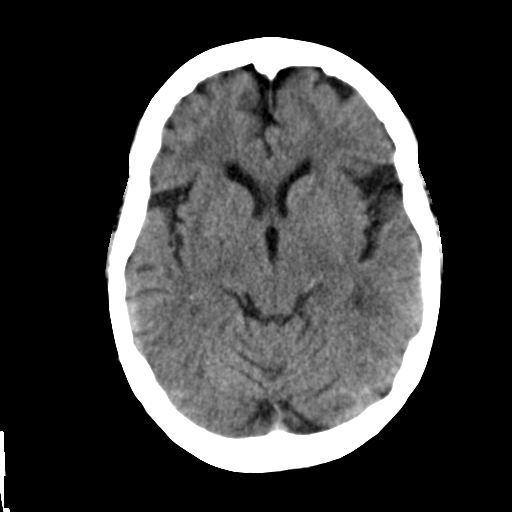
[im 11/31  bone]
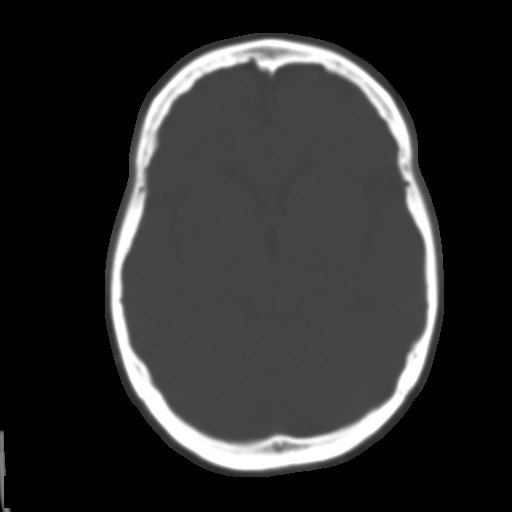
[im 13/31  brain]
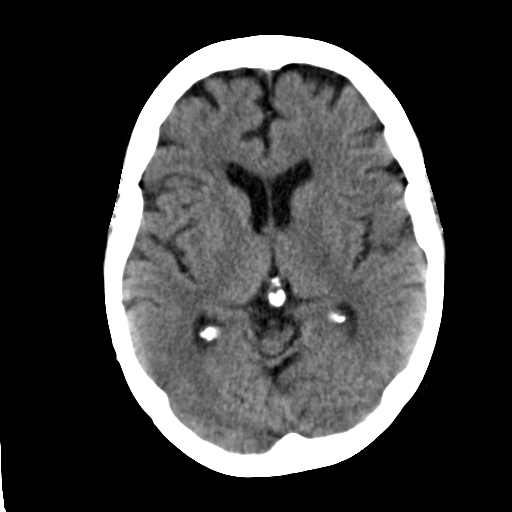
[im 16/31  brain]
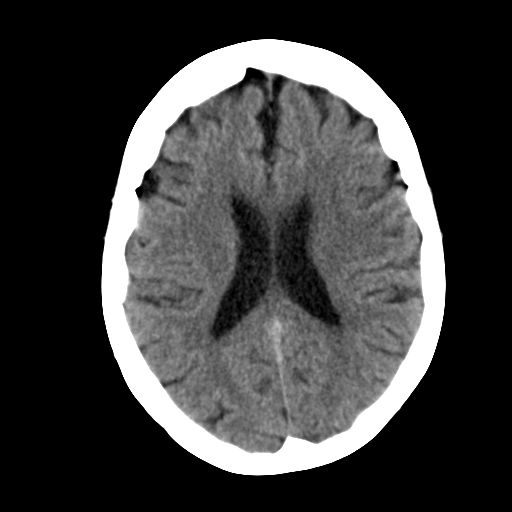
[im 18/31  brain]
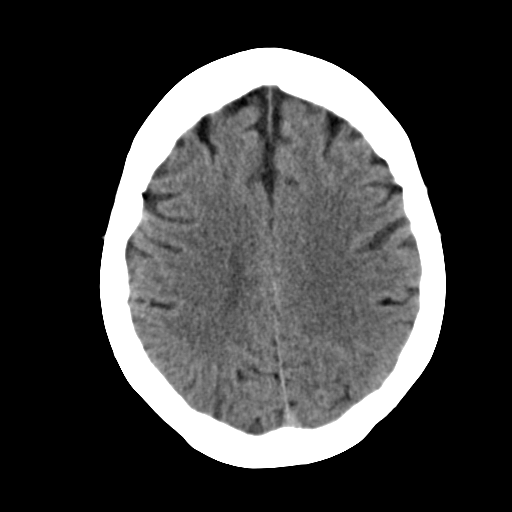
[im 20/31  brain]
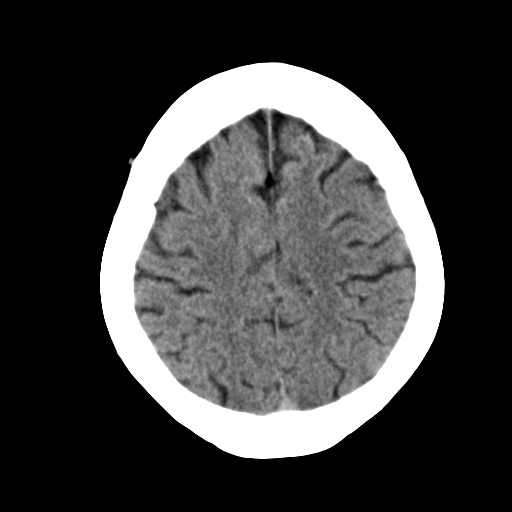
[im 20/31  bone]
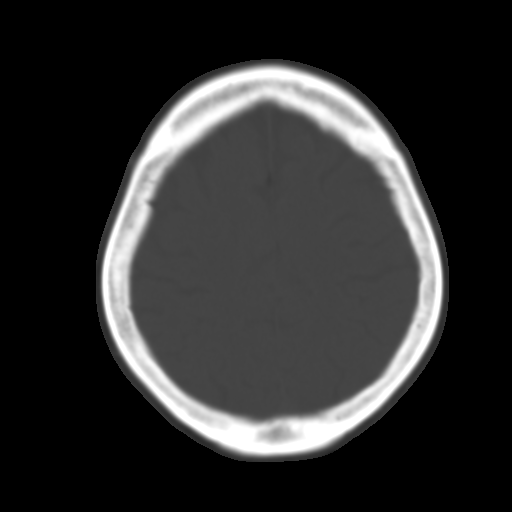
[im 22/31  brain]
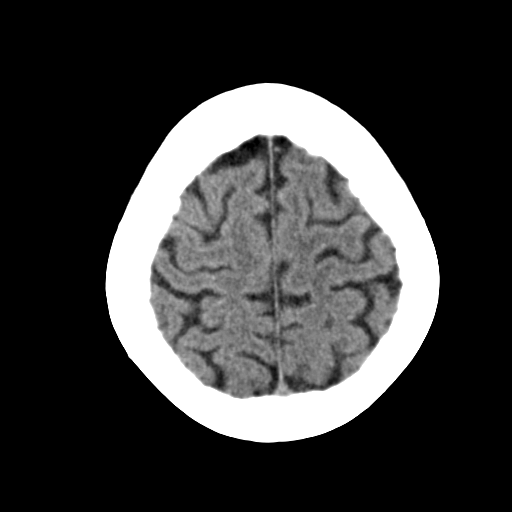
[im 24/31  brain]
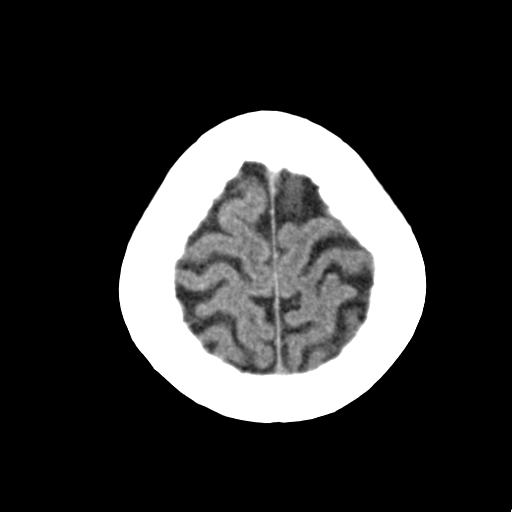
[im 26/31  brain]
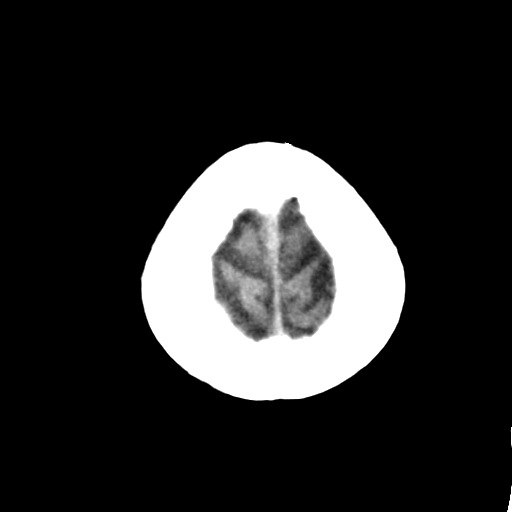
[im 28/31  brain]
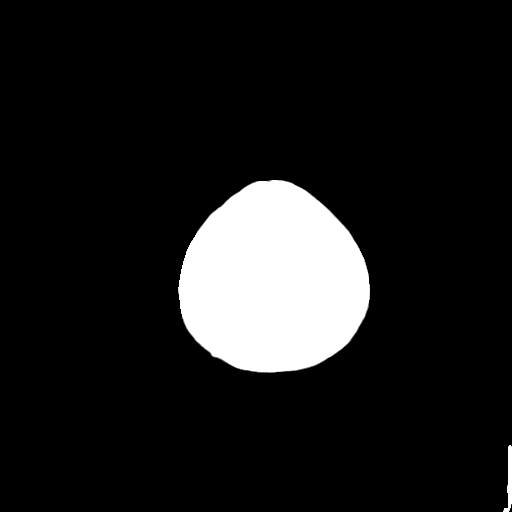
[im 28/31  bone]
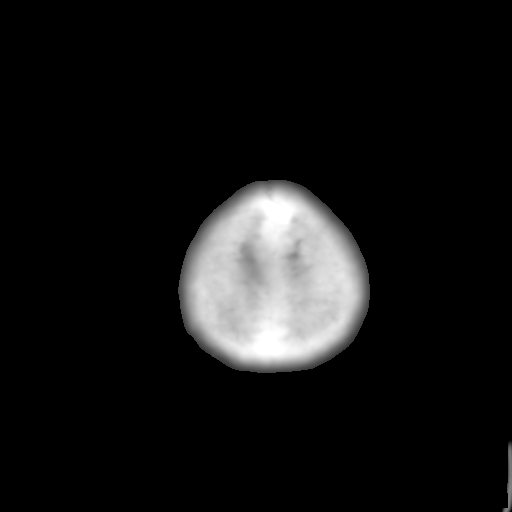

[15 of 30 positions shown; findings below may reference images not displayed]

FINDINGS: Mild noisy image quality.

The ventricles and sulci are normal for age. No intraparenchymal
hemorrhage, mass effect nor midline shift. Patchy supratentorial
white matter hypodensities are less than expected for patient's age
and though non-specific suggest sequelae of chronic small vessel
ischemic disease. No acute large vascular territory infarcts.

No abnormal extra-axial fluid collections. Basal cisterns are
patent. Severe calcific atherosclerosis of the carotid siphons and
included vertebral arteries.

No skull fracture. The included ocular globes and orbital contents
are non-suspicious. Status post bilateral ocular lens implant. The
mastoid aircells and included paranasal sinuses are well-aerated.
IMPRESSION: No acute intracranial process.

Severe calcific atherosclerosis. Involutional changes and mild
chronic small vessel ischemic disease.

## 2017-01-27 IMAGING — CT CT ANGIO NECK
2 of 8 series · 8 of 33 positions shown · IV contrast (APPLIED)
Comparison: Prior noncontrast head CT from earlier the same day.

CLINICAL DATA: Initial evaluation for acute left-sided numbness.

EXAM:
CT ANGIOGRAPHY HEAD AND NECK
TECHNIQUE: Multidetector CT imaging of the head and neck was performed using
the standard protocol during bolus administration of intravenous
contrast. Multiplanar CT image reconstructions and MIPs were
obtained to evaluate the vascular anatomy. Carotid stenosis
measurements (when applicable) are obtained utilizing NASCET
criteria, using the distal internal carotid diameter as the
denominator.
CONTRAST:  75mL OMNIPAQUE IOHEXOL 350 MG/ML SOLN

[Series 4: cta head · axial · 0.43mm/px · z∈[-302,-104]mm · 4 of 332 slices shown]
[im 67/332  soft-tissue]
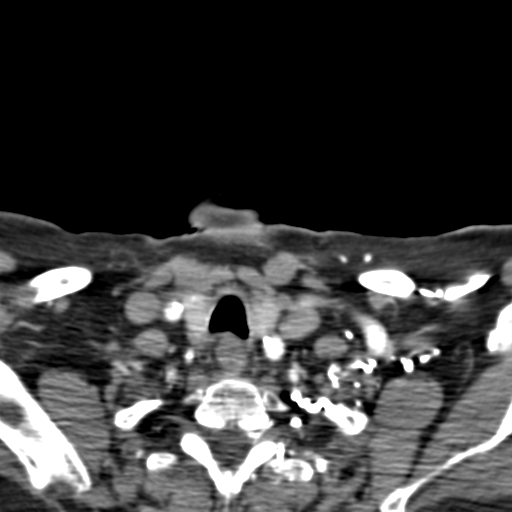
[im 133/332  bone]
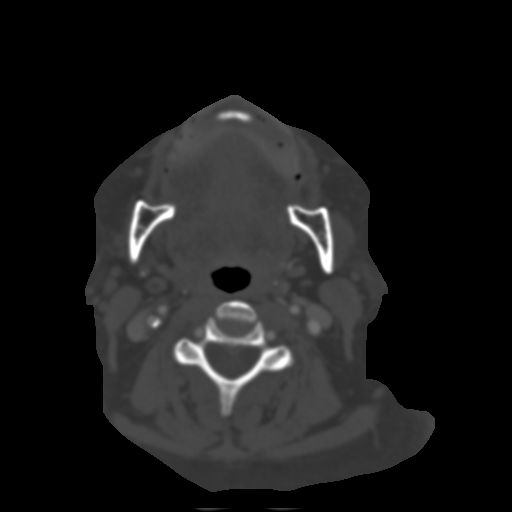
[im 199/332  soft-tissue]
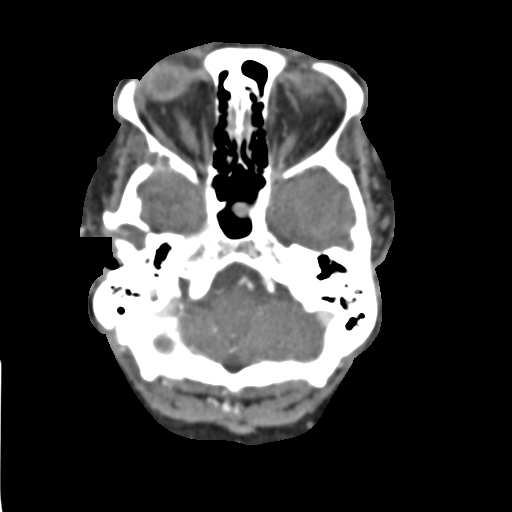
[im 265/332  bone]
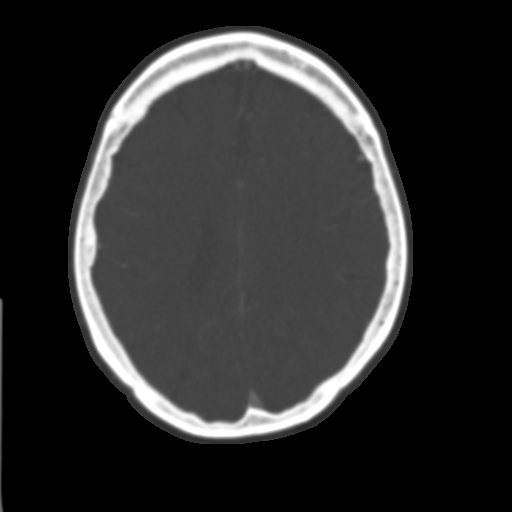

[Series 5: ax thin · axial · 0.30mm/px · z∈[-321,-127]mm · 4 of 331 slices shown]
[im 67/331  soft-tissue]
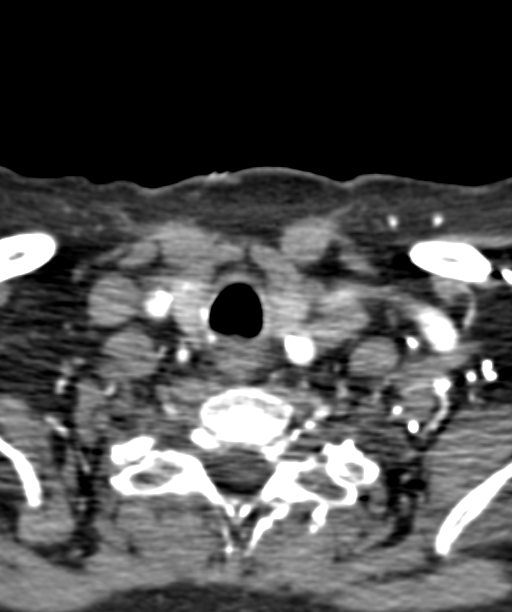
[im 133/331  soft-tissue]
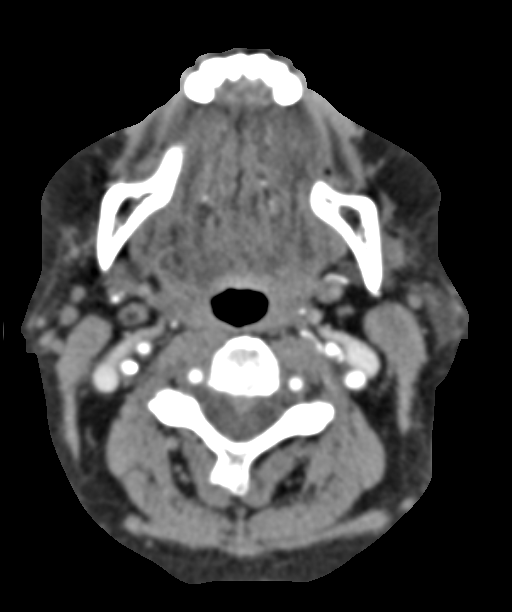
[im 199/331  soft-tissue]
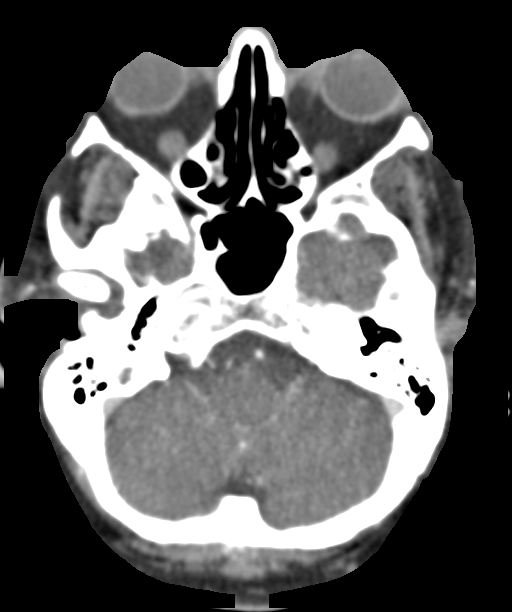
[im 265/331  soft-tissue]
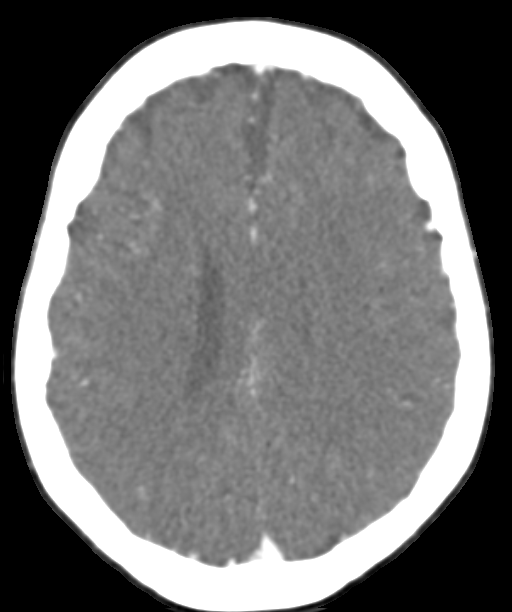

[8 of 33 positions shown; findings below may reference images not displayed]

FINDINGS: CTA NECK

Aortic arch: Visualize aortic arch is of normal caliber with
prominent atheromatous plaque. Bovine arch present with common
origin of the right brachiocephalic and left common carotid artery.
Heavy atheromatous plaque present about the proximal great vessels
bilaterally. There is essentially near complete occlusion of the
proximal left subclavian artery due to extensive atheromatous
plaque. Only a faint string of contrast extends through this region
(series 5, image 41). There is improved opacification of the left
subclavian artery at the level of the left vertebral artery,
suggesting at there may be a component of retrograde flow within the
left vertebral artery reflecting subclavian steal. Heterogeneous
atheromatous plaque present within knee right subclavian artery as
well with multi focal moderate stenoses.

Right carotid system: Atheromatous plaque at the origin of the right
common carotid artery with associated stenosis of approximately
30-40% by NASCET criteria. Heterogeneous plaque present distally
throughout the mid right ICA. Prominent atheromatous plaque present
about the right carotid bifurcation/proximal right ICA. Associated
stenosis of approximately 60-70% by NASCET criteria within the
proximal right ICA. Distally, the right ICA is well opacified to the
skullbase.

Left carotid system: Atheromatous plaque at the origin of the left
common carotid artery with associated stenosis of approximately 50%
by NASCET criteria. Scattered plaque present distally within the
left common carotid artery. Atheromatous plaque about the left
carotid bifurcation/proximal left ICA. There is associated stenosis
of approximately 50% by NASCET criteria within the proximal left
ICA. Distally, the left ICA is well opacified to the skullbase.

Vertebral arteries:Both vertebral arteries arise from the subclavian
arteries. Scattered plaque present at the origin of the right
vertebral artery without definite high-grade stenosis. Vertebral
arteries are otherwise well opacified to the skullbase without
evidence for stenosis, dissection, or occlusion.

Skeleton: No acute osseous abnormality. No worrisome lytic or
blastic osseous lesions. Mild degenerative spondylolysis noted
within the visualized spine.

Other neck: Visualized lungs demonstrate no acute process. No acute
soft tissue abnormality within the neck. Thyroid gland within normal
limits. No adenopathy.

CTA HEAD

Anterior circulation: Petrous segments of the internal carotid
arteries are patent bilaterally. There is extensive smooth
atheromatous plaque within the cavernous/ supraclinoid ICAs. There
is secondary moderate to severe diffuse narrowing, slightly worse on
the right. Left A1 segment patent. Right A1 segment appears to be
hypoplastic or absent. Anterior communicating artery and anterior
cerebral arteries grossly opacified.

M1 segments patent bilaterally without high-grade stenosis or
occlusion. Moderate narrowing at the left MCA bifurcation. Distal
MCA branches opacified bilaterally.

Posterior circulation: Scattered atheromatous plaque present within
the V4 segments bilaterally with associated moderate to severe focal
stenoses. Vertebral arteries are opacified to the vertebrobasilar
junction. Posterior inferior cerebral arteries appear patent.
Basilar artery patent. Superior cerebellar arteries patent
proximally. Both posterior cerebral arteries arise from the basilar
artery and are opacified to their distal aspects.

Venous sinuses: No venous sinus thrombosis.

Anatomic variants: No anatomic variant.  No aneurysm.

Delayed phase: No abnormal enhancement on delayed sequence.C
IMPRESSION: CTA NECK IMPRESSION:

1. Atheromatous plaque at the right carotid bifurcation/proximal
right ICA with associated stenosis of approximately 60-70% by NASCET
criteria.
2. Atheromatous plaque at the left carotid bifurcation/proximal left
ICA with associated stenosis of approximately 50% by NASCET
criteria.
3. Heavy atheromatous plaque at the origin of the proximal left
subclavian artery with near complete occlusion. There is
reconstitution distally at the origin of the left vertebral artery,
which may be due to retrograde flow within left vertebral artery.
Correlation for possible symptomatology of left-sided subclavian
steal recommended.
4. Widely patent vertebral arteries.

CTA HEAD IMPRESSION:

1. No large or proximal arterial branch occlusion within the
intracranial circulation.
2. Heavy atheromatous plaque within the cavernous/supraclinoid ICAs
bilaterally with associated diffuse moderate to severe narrowing.
3. Focal atheromatous plaque within the distal left V4 segments
bilaterally with associated moderate to severe stenoses.
4. Moderate nonocclusive focal stenosis at the distal left M1
segment/the left MCA bifurcation.

## 2018-07-14 DEATH — deceased
